# Patient Record
Sex: Female | Born: 1955 | Race: White | Hispanic: No | Smoking: Never smoker
Health system: Southern US, Community
[De-identification: ages and names within clinical notes are randomized; demographics above are authoritative.]

## PROBLEM LIST (undated history)

## (undated) DIAGNOSIS — F32A Depression, unspecified: Secondary | ICD-10-CM

## (undated) DIAGNOSIS — F329 Major depressive disorder, single episode, unspecified: Secondary | ICD-10-CM

## (undated) DIAGNOSIS — F419 Anxiety disorder, unspecified: Secondary | ICD-10-CM

## (undated) DIAGNOSIS — K219 Gastro-esophageal reflux disease without esophagitis: Secondary | ICD-10-CM

## (undated) DIAGNOSIS — C3490 Malignant neoplasm of unspecified part of unspecified bronchus or lung: Secondary | ICD-10-CM

## (undated) HISTORY — DX: Gastro-esophageal reflux disease without esophagitis: K21.9

## (undated) HISTORY — PX: CHOLECYSTECTOMY: SHX55

## (undated) HISTORY — DX: Major depressive disorder, single episode, unspecified: F32.9

## (undated) HISTORY — DX: Anxiety disorder, unspecified: F41.9

## (undated) HISTORY — PX: BREAST SURGERY: SHX581

## (undated) HISTORY — DX: Depression, unspecified: F32.A

## (undated) HISTORY — DX: Malignant neoplasm of unspecified part of unspecified bronchus or lung: C34.90

## (undated) HISTORY — PX: ABDOMINAL HYSTERECTOMY: SHX81

---

## 1998-08-19 ENCOUNTER — Encounter: Admission: RE | Admit: 1998-08-19 | Discharge: 1998-08-19 | Payer: Self-pay | Admitting: Family Medicine

## 1998-09-07 ENCOUNTER — Encounter: Admission: RE | Admit: 1998-09-07 | Discharge: 1998-09-07 | Payer: Self-pay | Admitting: Family Medicine

## 2000-09-20 ENCOUNTER — Encounter: Admission: RE | Admit: 2000-09-20 | Discharge: 2000-09-20 | Payer: Self-pay | Admitting: Family Medicine

## 2000-10-20 ENCOUNTER — Emergency Department (HOSPITAL_COMMUNITY): Admission: EM | Admit: 2000-10-20 | Discharge: 2000-10-20 | Payer: Self-pay | Admitting: *Deleted

## 2001-02-04 ENCOUNTER — Encounter: Payer: Self-pay | Admitting: Internal Medicine

## 2001-02-04 ENCOUNTER — Ambulatory Visit (HOSPITAL_COMMUNITY): Admission: RE | Admit: 2001-02-04 | Discharge: 2001-02-04 | Payer: Self-pay | Admitting: Internal Medicine

## 2001-02-06 ENCOUNTER — Ambulatory Visit (HOSPITAL_COMMUNITY): Admission: RE | Admit: 2001-02-06 | Discharge: 2001-02-06 | Payer: Self-pay | Admitting: Internal Medicine

## 2001-02-06 ENCOUNTER — Encounter: Payer: Self-pay | Admitting: Internal Medicine

## 2001-12-13 ENCOUNTER — Ambulatory Visit (HOSPITAL_COMMUNITY): Admission: RE | Admit: 2001-12-13 | Discharge: 2001-12-13 | Payer: Self-pay | Admitting: Internal Medicine

## 2001-12-13 ENCOUNTER — Encounter: Payer: Self-pay | Admitting: Internal Medicine

## 2002-03-26 ENCOUNTER — Emergency Department (HOSPITAL_COMMUNITY): Admission: EM | Admit: 2002-03-26 | Discharge: 2002-03-27 | Payer: Self-pay | Admitting: Emergency Medicine

## 2002-03-27 ENCOUNTER — Encounter: Payer: Self-pay | Admitting: Emergency Medicine

## 2002-04-04 ENCOUNTER — Encounter: Payer: Self-pay | Admitting: *Deleted

## 2002-04-04 ENCOUNTER — Ambulatory Visit (HOSPITAL_COMMUNITY): Admission: RE | Admit: 2002-04-04 | Discharge: 2002-04-04 | Payer: Self-pay | Admitting: *Deleted

## 2002-08-26 ENCOUNTER — Encounter: Payer: Self-pay | Admitting: Internal Medicine

## 2002-08-26 ENCOUNTER — Encounter: Admission: RE | Admit: 2002-08-26 | Discharge: 2002-08-26 | Payer: Self-pay | Admitting: Internal Medicine

## 2003-05-18 ENCOUNTER — Encounter: Admission: RE | Admit: 2003-05-18 | Discharge: 2003-05-18 | Payer: Self-pay | Admitting: Internal Medicine

## 2003-10-16 ENCOUNTER — Ambulatory Visit (HOSPITAL_COMMUNITY): Admission: RE | Admit: 2003-10-16 | Discharge: 2003-10-16 | Payer: Self-pay | Admitting: Pulmonary Disease

## 2005-03-30 ENCOUNTER — Ambulatory Visit (HOSPITAL_COMMUNITY): Admission: RE | Admit: 2005-03-30 | Discharge: 2005-03-30 | Payer: Self-pay | Admitting: Preventative Medicine

## 2005-11-15 ENCOUNTER — Emergency Department (HOSPITAL_COMMUNITY): Admission: EM | Admit: 2005-11-15 | Discharge: 2005-11-15 | Payer: Self-pay | Admitting: Emergency Medicine

## 2005-11-17 ENCOUNTER — Emergency Department (HOSPITAL_COMMUNITY): Admission: EM | Admit: 2005-11-17 | Discharge: 2005-11-17 | Payer: Self-pay | Admitting: Emergency Medicine

## 2005-11-26 ENCOUNTER — Emergency Department (HOSPITAL_COMMUNITY): Admission: EM | Admit: 2005-11-26 | Discharge: 2005-11-26 | Payer: Self-pay | Admitting: Family Medicine

## 2006-08-25 ENCOUNTER — Emergency Department (HOSPITAL_COMMUNITY): Admission: EM | Admit: 2006-08-25 | Discharge: 2006-08-25 | Payer: Self-pay | Admitting: Emergency Medicine

## 2007-02-27 ENCOUNTER — Emergency Department (HOSPITAL_COMMUNITY): Admission: EM | Admit: 2007-02-27 | Discharge: 2007-02-27 | Payer: Self-pay | Admitting: Emergency Medicine

## 2007-04-13 ENCOUNTER — Emergency Department (HOSPITAL_COMMUNITY): Admission: EM | Admit: 2007-04-13 | Discharge: 2007-04-13 | Payer: Self-pay | Admitting: Emergency Medicine

## 2008-06-05 ENCOUNTER — Ambulatory Visit (HOSPITAL_COMMUNITY): Admission: RE | Admit: 2008-06-05 | Discharge: 2008-06-05 | Payer: Self-pay | Admitting: Preventative Medicine

## 2008-06-15 ENCOUNTER — Encounter: Admission: RE | Admit: 2008-06-15 | Discharge: 2008-06-15 | Payer: Self-pay | Admitting: Preventative Medicine

## 2008-06-15 ENCOUNTER — Encounter (INDEPENDENT_AMBULATORY_CARE_PROVIDER_SITE_OTHER): Payer: Self-pay | Admitting: Diagnostic Radiology

## 2008-12-15 ENCOUNTER — Encounter: Admission: RE | Admit: 2008-12-15 | Discharge: 2008-12-15 | Payer: Self-pay | Admitting: Obstetrics & Gynecology

## 2010-08-23 ENCOUNTER — Emergency Department (HOSPITAL_COMMUNITY)
Admission: EM | Admit: 2010-08-23 | Discharge: 2010-08-23 | Disposition: A | Discharge status: Home / Self Care | Payer: No Typology Code available for payment source | Attending: Emergency Medicine | Admitting: Emergency Medicine

## 2010-08-23 ENCOUNTER — Emergency Department (HOSPITAL_COMMUNITY): Payer: No Typology Code available for payment source

## 2010-08-23 DIAGNOSIS — J218 Acute bronchiolitis due to other specified organisms: Secondary | ICD-10-CM | POA: Insufficient documentation

## 2010-08-23 DIAGNOSIS — R079 Chest pain, unspecified: Secondary | ICD-10-CM

## 2010-08-23 DIAGNOSIS — R799 Abnormal finding of blood chemistry, unspecified: Secondary | ICD-10-CM | POA: Insufficient documentation

## 2010-08-23 DIAGNOSIS — R5381 Other malaise: Secondary | ICD-10-CM | POA: Insufficient documentation

## 2010-08-23 DIAGNOSIS — R5383 Other fatigue: Secondary | ICD-10-CM | POA: Insufficient documentation

## 2010-08-23 DIAGNOSIS — Z9889 Other specified postprocedural states: Secondary | ICD-10-CM | POA: Insufficient documentation

## 2010-08-23 DIAGNOSIS — R072 Precordial pain: Secondary | ICD-10-CM | POA: Insufficient documentation

## 2010-08-23 LAB — DIFFERENTIAL
Basophils Absolute: 0 10*3/uL (ref 0.0–0.1)
Basophils Relative: 1 % (ref 0–1)
Eosinophils Relative: 1 % (ref 0–5)
Lymphocytes Relative: 29 % (ref 12–46)
Neutro Abs: 4 10*3/uL (ref 1.7–7.7)

## 2010-08-23 LAB — D-DIMER, QUANTITATIVE: D-Dimer, Quant: 0.58 ug/mL-FEU — ABNORMAL HIGH (ref 0.00–0.48)

## 2010-08-23 LAB — POCT CARDIAC MARKERS
CKMB, poc: <1 ng/mL — ABNORMAL LOW (ref 1.0–8.0)
Myoglobin, poc: 32.9 ng/mL (ref 12–200)
Troponin i, poc: <0.05 ng/mL (ref 0.00–0.09)

## 2010-08-23 LAB — CK TOTAL AND CKMB (NOT AT ARMC)
Relative Index: INVALID (ref 0.0–2.5)
Total CK: 49 U/L (ref 7–177)

## 2010-08-23 LAB — CBC
HCT: 41.9 % (ref 36.0–46.0)
Hemoglobin: 14.1 g/dL (ref 12.0–15.0)
MCHC: 33.7 g/dL (ref 30.0–36.0)
RDW: 11.7 % (ref 11.5–15.5)
WBC: 6.5 10*3/uL (ref 4.0–10.5)

## 2010-08-23 LAB — POCT I-STAT, CHEM 8
Chloride: 105 mEq/L (ref 96–112)
Creatinine, Ser: 0.7 mg/dL (ref 0.4–1.2)
Glucose, Bld: 95 mg/dL (ref 70–99)
Potassium: 3.6 mEq/L (ref 3.5–5.1)
Sodium: 142 mEq/L (ref 135–145)

## 2010-08-23 MED ORDER — IOHEXOL 300 MG/ML  SOLN
80.0000 mL | Freq: Once | INTRAMUSCULAR | Status: AC | PRN
  Administered 2010-08-23: 80 mL via INTRAVENOUS

## 2010-08-30 NOTE — Consult Note (Signed)
Tracey Fox, Tracey Fox                ACCOUNT NO.:  [id number]  MEDICAL RECORD NO.:  [id number]           PATIENT TYPE:  E  LOCATION:  [facility]                         FACILITY:  [facility]  PHYSICIAN:  Madolyn Frieze. Jens Som, MD, FACCDATE OF BIRTH:  05-03-1956  DATE OF CONSULTATION:  08/23/2010 DATE OF DISCHARGE:                                CONSULTATION   PRIMARY CARE PHYSICIAN:  At Urgent Care.  PRIMARY CARDIOLOGIST:  None.  CHIEF COMPLAINT:  Chest pain.  HISTORY OF PRESENT ILLNESS:  Tracey Fox is a 55 year old female with no previous history of coronary artery disease.  She had onset of substernal chest pain 4 days ago.  It has been continuous since then. She describes it as a pressure.  At its worst, it was an 8/10.  At its least, it was a 4/10.  She feels that it is somewhat worse with deep inspiration.  She tried a Xanax, which helped to relax, but she does not feel like it made any real change in her chest pain.  She also tried ibuprofen, which did not give her significant relief.  She does not feel like there was any change in her pain with position or exertion. Resting was no help.  She has had some anxiety and social stressors.  On August 20, 2010, she had left arm pain, but this resolved.  The pain was associated with shortness of breath and occasional diaphoresis, but no nausea or vomiting.  She had an evaluation for a sharp chest pain in approximately 2003, but those symptoms are nothing like these.  Today she was concerned because the pain had not resolved and went to Urgent Care.  She was referred to the emergency room for ongoing symptoms and an EKG that was a little abnormal.  Currently she is complaining of pain at a 4 or 5/10.  PAST MEDICAL HISTORY: 1. History of chest pain in approximately 2003 for which she saw Dr.     Domingo Sep.  Per the patient, she had a stress test and an echo that     were okay. 2. Family history of premature coronary artery disease.  SURGICAL  HISTORY:  She is status post hysterectomy, cholecystectomy, and breast biopsy.  ALLERGIES:  No known drug allergies.  MEDICATIONS:  No prescription medications and only occasional ibuprofen.  SOCIAL HISTORY:  She lives in [place] with her daughter.  She is separated from her husband.  She works as an Print production planner.  She has no history of alcohol, tobacco, or drug abuse.  She has multiple social stressors right now.  FAMILY HISTORY:  Her mother is alive in her 53s with Alzheimer's, but no heart disease and her father died at 32 with an MI.  She had a sister that died of breast cancer in her 84s.  REVIEW OF SYSTEMS:  She has had some diaphoresis with the chest pain. She also has occasional chills.  She has occasional headaches and arthralgias.  She has felt anxious recently.  She denies GI symptoms or reflux.  There has been no melena.  Chest pain and shortness of breath as described above.  Full 14-point review of systems is otherwise negative except as stated in the HPI.  PHYSICAL EXAMINATION:  VITAL SIGNS:  Temperature is 97.9, blood pressure 114/70, pulse 64, respiratory rate 18, O2 saturation 100% on room air. GENERAL:  She is a well-developed, well-nourished white female who is anxious. HEENT:  Normal. NECK:  There is no lymphadenopathy, thyromegaly, bruit, or JVD noted. CV:  Heart is regular in rate and rhythm with an S1 and S2 and no significant murmur, rub, or gallop is noted.  Distal pulses are intact in all four extremities. LUNGS:  Clear to auscultation bilaterally. SKIN:  No rashes or lesions are noted. ABDOMEN:  Soft and nontender with active bowel sounds. EXTREMITIES:  There is no cyanosis, clubbing, or edema noted. MUSCULOSKELETAL:  There is no joint deformity or effusions.  Her chest wall is slightly tender to palpation.  There is no spine or CVA tenderness. NEURO:  She is alert and oriented.  Cranial nerves II through XII grossly intact.  Chest x-ray  shows mild interstitial prominence, but no pneumonia or cardiac enlargement.  EKG sinus brady with rate 59 with no acute ischemic changes.  Laboratory values show point-of-care markers negative x1 with other labs pending at the time of dictation.  IMPRESSION:  Tracey Fox was seen today by Dr. Jens Som, the patient evaluated and the data reviewed.  She is a 55 year old female with no significant past medical history who had chest pain.  She complains of 4 continuous days of chest pain that she describes as a pressure without radiation.  There are no clearly associated symptoms.  There is possibly a pleuritic component.  It is not exertional, not related to food.  The patient feels it is related to "stress."  Her EKG is sinus with anterior T-wave changes.  She has chest pain with palpation of her chest wall. It is continuous for 4 days without complete resolution.  Full enzymes including a CK-MB and troponin are pending as well as a D-dimer.  If they are negative then she has essentially ruled out.  The D-dimer will help [place] assess the pleuritic component, although we doubt PE.  If the above testing is negative, she does not need admission from a cardiac standpoint.  The emergency room staff is aware of this.  From a cardiac standpoint, she can be discharged with a short course of Motrin and follow up primary care.     Theodore Demark, PA-C   ______________________________ Madolyn Frieze. Jens Som, MD, [facility]    RB/MEDQ  D:  08/23/2010  T:  08/24/2010  Job:  [id number]  Electronically Signed by Theodore Demark PA-C on 08/27/2010 04:51:54 PM Electronically Signed by Olga Millers MD FACC on 08/30/2010 05:22:26 AM

## 2011-02-21 LAB — POCT URINALYSIS DIP (DEVICE)
Ketones, ur: NEGATIVE
Operator id: 247071
Protein, ur: NEGATIVE
Specific Gravity, Urine: 1.02
Urobilinogen, UA: 0.2
pH: 5.5

## 2011-02-21 LAB — URINE CULTURE

## 2011-02-21 LAB — URINALYSIS, ROUTINE W REFLEX MICROSCOPIC
Bilirubin Urine: NEGATIVE
Nitrite: NEGATIVE
Specific Gravity, Urine: 1.018
Urobilinogen, UA: 1
pH: 5.5

## 2011-02-21 LAB — URINE MICROSCOPIC-ADD ON

## 2011-02-23 ENCOUNTER — Institutional Professional Consult (permissible substitution): Payer: No Typology Code available for payment source | Admitting: Pulmonary Disease

## 2011-10-29 ENCOUNTER — Encounter (HOSPITAL_COMMUNITY): Payer: Self-pay | Admitting: Emergency Medicine

## 2011-10-29 ENCOUNTER — Ambulatory Visit: Payer: PRIVATE HEALTH INSURANCE

## 2011-10-29 ENCOUNTER — Ambulatory Visit (INDEPENDENT_AMBULATORY_CARE_PROVIDER_SITE_OTHER): Payer: PRIVATE HEALTH INSURANCE | Admitting: Emergency Medicine

## 2011-10-29 ENCOUNTER — Observation Stay (HOSPITAL_COMMUNITY)
Admission: EM | Admit: 2011-10-29 | Discharge: 2011-10-30 | Disposition: A | Discharge status: Home / Self Care | Payer: 59 | Attending: Emergency Medicine | Admitting: Emergency Medicine

## 2011-10-29 VITALS — BP 121/73 | HR 62 | Temp 97.9°F | Resp 16 | Ht 67.75 in | Wt 144.0 lb

## 2011-10-29 DIAGNOSIS — R079 Chest pain, unspecified: Principal | ICD-10-CM | POA: Insufficient documentation

## 2011-10-29 DIAGNOSIS — R062 Wheezing: Secondary | ICD-10-CM | POA: Insufficient documentation

## 2011-10-29 DIAGNOSIS — R5383 Other fatigue: Secondary | ICD-10-CM | POA: Insufficient documentation

## 2011-10-29 DIAGNOSIS — R5381 Other malaise: Secondary | ICD-10-CM | POA: Insufficient documentation

## 2011-10-29 DIAGNOSIS — R911 Solitary pulmonary nodule: Secondary | ICD-10-CM | POA: Insufficient documentation

## 2011-10-29 LAB — POCT I-STAT TROPONIN I: Troponin i, poc: 0 ng/mL (ref 0.00–0.08)

## 2011-10-29 LAB — BASIC METABOLIC PANEL
BUN: 11 mg/dL (ref 6–23)
Calcium: 10 mg/dL (ref 8.4–10.5)
Chloride: 101 mEq/L (ref 96–112)
Creatinine, Ser: 0.64 mg/dL (ref 0.50–1.10)
GFR calc Af Amer: >90 mL/min (ref >90)
GFR calc non Af Amer: >90 mL/min (ref >90)

## 2011-10-29 LAB — CBC
HCT: 41.4 % (ref 36.0–46.0)
MCH: 31 pg (ref 26.0–34.0)
MCHC: 35.5 g/dL (ref 30.0–36.0)
MCV: 87.3 fL (ref 78.0–100.0)
Platelets: 232 10*3/uL (ref 150–400)
RDW: 11.9 % (ref 11.5–15.5)
WBC: 6 10*3/uL (ref 4.0–10.5)

## 2011-10-29 MED ORDER — ALPRAZOLAM 0.25 MG PO TABS
1.0000 mg | ORAL_TABLET | ORAL | Status: DC | PRN
  Administered 2011-10-29: 1 mg via ORAL
  Filled 2011-10-29: qty 4

## 2011-10-29 MED ORDER — SODIUM CHLORIDE 0.9 % IV SOLN
1000.0000 mL | INTRAVENOUS | Status: DC
  Administered 2011-10-30: 1000 mL via INTRAVENOUS

## 2011-10-29 NOTE — Progress Notes (Signed)
Subjective:    Patient ID: Tracey Fox, female    DOB: November 08, 1955, 56 y.o.   MRN: [id number]  Chest Pain  This is a recurrent problem. The current episode started in the past 7 days. The onset quality is gradual. The problem occurs 2 to 4 times per day. The problem has been unchanged. The pain is present in the substernal region. The pain is at a severity of 4/10. The pain is moderate. The quality of the pain is described as dull and pressure. The pain does not radiate. Associated symptoms include irregular heartbeat, malaise/fatigue and palpitations. Pertinent negatives include no abdominal pain, back pain, claudication, cough, diaphoresis, dizziness, exertional chest pressure, fever, headaches, hemoptysis, leg pain, lower extremity edema, nausea, near-syncope, numbness, orthopnea, PND, shortness of breath, sputum production, syncope, vomiting or weakness. The pain is aggravated by nothing. She has tried nothing for the symptoms. Risk factors include stress, post-menopausal, lack of exercise and sedentary lifestyle.  Pertinent negatives for past medical history include no aneurysm, no anxiety/panic attacks, no aortic aneurysm, no aortic dissection, no arrhythmia, no bicuspid aortic valve, no CAD, no cancer, no congenital heart disease, no connective tissue disease, no COPD, no CHF, no diabetes, no DVT, no hyperhomocysteinemia, no hyperlipidemia, no hypertension, no Kawasaki disease, no Marfan's syndrome, no MI, no mitral valve prolapse, no pacemaker, no PE, no PVD, no recent injury, no rheumatic fever, no seizures, no sickle cell disease, no sleep apnea, no spontaneous pneumothorax, no stimulant use, no strokes, no thyroid problem, no TIA, Turner syndrome and no valve disorder.  Pertinent negatives for family medical history include: family history of aortic dissection, no CAD in family, no connective tissue disease in family, no diabetes in family, no heart disease in family, no hyperlipidemia in  family, no hypertension in family, no Marfan's syndrome in family, no early MI in family, no PE in family, no PVD in family, no sickle cell disease in family, no stroke in family, no sudden death in family and no TIA in family.      Review of Systems  Constitutional: Positive for malaise/fatigue, activity change and fatigue. Negative for fever, diaphoresis and unexpected weight change.  HENT: Negative.   Eyes: Negative.   Respiratory: Positive for chest tightness. Negative for cough, hemoptysis, sputum production, choking and shortness of breath.   Cardiovascular: Positive for chest pain and palpitations. Negative for orthopnea, claudication, leg swelling, syncope, PND and near-syncope.  Gastrointestinal: Negative.  Negative for nausea, vomiting and abdominal pain.  Genitourinary: Negative.   Musculoskeletal: Negative.  Negative for back pain.  Neurological: Negative.  Negative for dizziness, seizures, weakness, numbness and headaches.       Objective:   Physical Exam  Constitutional: She is oriented to person, place, and time. She appears well-developed and well-nourished.  HENT:  Head: Normocephalic and atraumatic.  Right Ear: External ear normal.  Left Ear: External ear normal.  Eyes: Conjunctivae and EOM are normal. Pupils are equal, round, and reactive to light. No scleral icterus.  Neck: Normal range of motion. Neck supple.  Cardiovascular: Normal rate, regular rhythm and normal heart sounds.  Exam reveals no gallop and no friction rub.   No murmur heard. Pulmonary/Chest: Effort normal and breath sounds normal. She has no wheezes. She has no rales. She exhibits no tenderness.  Abdominal: Soft.  Musculoskeletal: Normal range of motion.  Neurological: She is alert and oriented to person, place, and time.  Skin: Skin is warm and dry.  Assessment & Plan:  1 week history of increasing fatigue and chest pain that is not related to activity and not relieved by  anything.  Says lasts 30 minutes or so but today's pain started at work several hours ago.    Incidentally told that she had a "knot on her lung" a year ago and was referred to a pulmonologist but never did.  [facility] reading (PRIMARY) by  Dr. Dareen Piano.  Abnormal air collection anterior to mediastinum.  Will refer to ER tonight for further evaluation

## 2011-10-29 NOTE — ED Notes (Signed)
[facility]  Pt does not have EKG with her and unable to locate it in [facility].  Pt does have a CD with CXR.

## 2011-10-29 NOTE — ED Provider Notes (Signed)
History     CSN: [id number]  Arrival date & time 10/29/11  1840   First MD Initiated Contact with Patient 10/29/11 2127      Chief Complaint  Patient presents with  . Chest Pain    (Consider location/radiation/quality/duration/timing/severity/associated sxs/prior treatment) HPI Comments: Patient with no significant past medical history presents emergency department with chief complaint of chest pain.  Patient was sent over from urgent care for "an abnormal finding on chest x-ray".  Imaging reviewed with attending and chronic incidental finding of 6 mm nodule was seen and radiologist's interpretation indicates no acute cardiopulmonary process.  Patient states that she's been having intermittent chest pain for the last 7 days located substernally described as a cold pressure, lasting approximately an hour and associated with ED fatigue.  Patient denies any shortness of breath, dyspnea on exertion, orthopnea, PND, leg swelling, palpitations, jaw pain or arm pain, cough, fever, night sweats or chills.  Patient has no other complaints this time.  The history is provided by the patient.    History reviewed. No pertinent past medical history.  Past Surgical History  Procedure Date  . Abdominal hysterectomy   . Breast surgery   . Cholecystectomy     No family history on file.  History  Substance Use Topics  . Smoking status: Never Smoker   . Smokeless tobacco: Not on file  . Alcohol Use: No    OB History    Grav Para Term Preterm Abortions TAB SAB Ect Mult Living                  Review of Systems  Constitutional: Positive for activity change and fatigue. Negative for fever, chills, diaphoresis and unexpected weight change.  HENT: Negative for congestion, neck pain and neck stiffness.   Eyes: Negative for visual disturbance.  Respiratory: Positive for chest tightness. Negative for apnea, cough, shortness of breath, wheezing and stridor.   Cardiovascular: Positive for chest  pain. Negative for palpitations and leg swelling.  Gastrointestinal: Negative for nausea, vomiting, abdominal pain, diarrhea and blood in stool.  Genitourinary: Negative for dysuria, urgency, hematuria and flank pain.  Musculoskeletal: Negative for myalgias, back pain and gait problem.  Skin: Negative for pallor.  Neurological: Negative for dizziness, syncope, weakness, light-headedness and headaches.  All other systems reviewed and are negative.    Allergies  Review of patient's allergies indicates no known allergies.  Home Medications   Current Outpatient Rx  Name Route Sig Dispense Refill  . ALPRAZOLAM 1 MG PO TABS Oral Take 1 mg by mouth at bedtime as needed. For sleep    . IBUPROFEN 200 MG PO TABS Oral Take 600 mg by mouth every 6 (six) hours as needed. For pain      BP 124/73  Pulse 60  Temp 97.8 F (36.6 C) (Oral)  Resp 20  SpO2 96%  Physical Exam  Nursing note and vitals reviewed. Constitutional: She appears well-developed and well-nourished. No distress.  HENT:  Head: Normocephalic and atraumatic.  Eyes: Conjunctivae and EOM are normal. Pupils are equal, round, and reactive to light.  Neck: Normal range of motion. Neck supple. Normal carotid pulses and no JVD present. Carotid bruit is not present. No rigidity. Normal range of motion present.  Cardiovascular: Normal rate, regular rhythm, S1 normal, S2 normal, normal heart sounds, intact distal pulses and normal pulses.  Exam reveals no gallop and no friction rub.   No murmur heard.      No pitting edema bilaterally, RRR, no  aberrant sounds on auscultations, distal pulses intact, no carotid bruit or JVD.   Pulmonary/Chest: Effort normal. No accessory muscle usage or stridor. She exhibits no bony tenderness.       Mild inspiratory wheezing heard in right upper lung, no respiratory distress, accessory muscle use or nasal flaring.  Abdominal: Bowel sounds are normal.       Soft non tender. Non pulsatile aorta.   Skin:  Skin is warm, dry and intact. No rash noted. She is not diaphoretic. No cyanosis. Nails show no clubbing.    ED Course  Procedures (including critical care time)   Labs Reviewed  CBC  BASIC METABOLIC PANEL  POCT I-STAT TROPONIN I   Dg Chest 2 View  10/29/2011  *RADIOLOGY REPORT*  Clinical Data: Chest pain.  Abnormal chest x-ray in the past.  CHEST - 2 VIEW  Comparison: 08/23/2010.  Findings: Cardiopericardial silhouette and mediastinal contours are normal. No airspace disease or effusion.  Mild bilateral pleural apical scarring.  Prominent nipple shadows are present bilaterally.  Previously seen 6 mm pulmonary nodule appears present in the right upper lobe interposed between the anterior first and second ribs. Enlargement retrosternal clear space is present, however this appears similar to the prior exam of 02/27/2007.  Retrosternal nodular density not definitively identified on today's exam.  IMPRESSION: No acute cardiopulmonary disease.  Clinically significant discrepancy from primary report, if provided: None  Original Report Authenticated By: Andreas Newport, M.D.     No diagnosis found.   Date: 10/29/2011  Rate: 52  Rhythm: Sinus brady  QRS Axis: normal  Intervals: normal  ST/T Wave abnormalities: normal  Conduction Disutrbances: none  Narrative Interpretation:   Old EKG Reviewed: No significant changes noted     MDM  Chest pain  Patient moved to CDU under chest pain protocol. Patient resting comfortably at present without return of chest pain. Lungs CTA bilaterally. S1/S2, RRR, no murmur. Abdomen soft, bowel sounds present. Strong distal pulses palpated all extremities. Sinus rhythm on monitor without ectopy. Troponin negative x 1, 12 lead reviewed, no indication of ischemia. Patient scheduled for coronary CT in AM. Diagnostic and treatment plan discussed with patient.         Jaci Carrel, [place] 10/29/11 2250

## 2011-10-29 NOTE — ED Notes (Addendum)
C/o intermittent dull pain in center of chest with sob and generalized weakness x 1 week.  [facility] sent [facility] for "abnormal [facility]R."

## 2011-10-30 ENCOUNTER — Observation Stay (HOSPITAL_COMMUNITY): Payer: 59

## 2011-10-30 MED ORDER — NITROGLYCERIN 0.4 MG SL SUBL
SUBLINGUAL_TABLET | SUBLINGUAL | Status: AC
  Administered 2011-10-30: 10:00:00
  Filled 2011-10-30: qty 25

## 2011-10-30 MED ORDER — ASPIRIN 81 MG PO CHEW
324.0000 mg | CHEWABLE_TABLET | Freq: Once | ORAL | Status: AC
  Administered 2011-10-30: 324 mg via ORAL

## 2011-10-30 MED ORDER — OMEPRAZOLE 20 MG PO CPDR: 20.0000 mg | DELAYED_RELEASE_CAPSULE | Freq: Every day | ORAL | Status: DC

## 2011-10-30 MED ORDER — IOHEXOL 350 MG/ML SOLN
80.0000 mL | Freq: Once | INTRAVENOUS | Status: AC | PRN
  Administered 2011-10-30: 80 mL via INTRAVENOUS

## 2011-10-30 MED ORDER — METOPROLOL TARTRATE 1 MG/ML IV SOLN
INTRAVENOUS | Status: AC
  Administered 2011-10-30: 5 mg
  Filled 2011-10-30: qty 5

## 2011-10-30 MED ORDER — ASPIRIN 81 MG PO CHEW
CHEWABLE_TABLET | ORAL | Status: AC
  Filled 2011-10-30: qty 4

## 2011-10-30 NOTE — ED Notes (Signed)
Did not provide pt with AM dose of Lopressor per CP protocol.

## 2011-10-30 NOTE — Discharge Instructions (Signed)
Chest Pain (Nonspecific) Your testing today did not show any evidence of coronary artery disease. You have lung nodules and a repeat CT scan in one year. Take the medication for her stomach and that may be causing your chest pain. She is a Dr. from the attached list. Return to the ED if you develop they're worsening symptoms. It is often hard to give a specific diagnosis for the cause of chest pain. There is always a chance that your pain could be related to something serious, such as a heart attack or a blood clot in the lungs. You need to follow up with your caregiver for further evaluation. CAUSES   Heartburn.   Pneumonia or bronchitis.   Anxiety or stress.   Inflammation around your heart (pericarditis) or lung (pleuritis or pleurisy).   A blood clot in the lung.   A collapsed lung (pneumothorax). It can develop suddenly on its own (spontaneous pneumothorax) or from injury (trauma) to the chest.   Shingles infection (herpes zoster virus).  The chest wall is composed of bones, muscles, and cartilage. Any of these can be the source of the pain.  The bones can be bruised by injury.   The muscles or cartilage can be strained by coughing or overwork.   The cartilage can be affected by inflammation and become sore (costochondritis).  DIAGNOSIS  Lab tests or other studies, such as X-rays, electrocardiography, stress testing, or cardiac imaging, may be needed to find the cause of your pain.  TREATMENT   Treatment depends on what may be causing your chest pain. Treatment may include:   Acid blockers for heartburn.   Anti-inflammatory medicine.   Pain medicine for inflammatory conditions.   Antibiotics if an infection is present.   You may be advised to change lifestyle habits. This includes stopping smoking and avoiding alcohol, caffeine, and chocolate.   You may be advised to keep your head raised (elevated) when sleeping. This reduces the chance of acid going backward from your  stomach into your esophagus.   Most of the time, nonspecific chest pain will improve within 2 to 3 days with rest and mild pain medicine.  HOME CARE INSTRUCTIONS   If antibiotics were prescribed, take your antibiotics as directed. Finish them even if you start to feel better.   For the next few days, avoid physical activities that bring on chest pain. Continue physical activities as directed.   Do not smoke.   Avoid drinking alcohol.   Only take over-the-counter or prescription medicine for pain, discomfort, or fever as directed by your caregiver.   Follow your caregiver's suggestions for further testing if your chest pain does not go away.   Keep any follow-up appointments you made. If you do not go to an appointment, you could develop lasting (chronic) problems with pain. If there is any problem keeping an appointment, you must call to reschedule.  SEEK MEDICAL CARE IF:   You think you are having problems from the medicine you are taking. Read your medicine instructions carefully.   Your chest pain does not go away, even after treatment.   You develop a rash with blisters on your chest.  SEEK IMMEDIATE MEDICAL CARE IF:   You have increased chest pain or pain that spreads to your arm, neck, jaw, back, or abdomen.   You develop shortness of breath, an increasing cough, or you are coughing up blood.   You have severe back or abdominal pain, feel nauseous, or vomit.   You develop  severe weakness, fainting, or chills.   You have a fever.  THIS IS AN EMERGENCY. Do not wait to see if the pain will go away. Get medical help at once. Call your local emergency services (911 in [place] Do not drive yourself to the hospital. MAKE SURE YOU:   Understand these instructions.   Will watch your condition.   Will get help right away if you are not doing well or get worse.  Document Released: 02/08/2005 Document Revised: 04/20/2011 Document Reviewed: 12/05/2007 [facility] Patient  Information 2012 [place], [place].  RESOURCE GUIDE  Chronic Pain Problems: Contact [facility]  [phone] Patients need to be referred by their primary care doctor.  Insufficient Money for Medicine: Contact United Way:  call "211" or [facility] [phone].  No Primary Care Doctor: - Call Health Connect  [phone] - can help you locate a primary care doctor that  accepts your insurance, provides certain services, etc. - Physician Referral Service- [phone]  Agencies that provide inexpensive medical care: - [facility]  [phone] - [facility]  [phone] - [facility]  [phone] - Women's Clinic  [phone] - Planned Parenthood  [phone] [facility]  [phone]  Medicaid-accepting [facility] Providers: - [facility]- [address]  [phone], Mon-Fri 9am-7pm, Sat 9am-1pm - [facility]- [address] [place], Suite [place]  [phone] - [facility]- [address], [address] [place]  [phone] [facility]- [address]  [phone] - Renaye Rakers- [address] [place], [address], [phone]  Only accepts [place] patients after they have their name  applied to their card  Self Pay (no insurance) in [place]: - Sickle Cell Patients: Dr Willey Blade, [facility]  [address] [place], [phone] - [facility]- [address] [place]  [phone]       [facility] [place]- [address]       -     [facility]- see information above (Speak to [employer] if you do not have insurance)       -  [facility]- [address] [place], [phone]       -  [facility]- [address],  [phone]       -  [facility]- [address] [phone]       -  Dr Julio Sicks-  [address], [place], [phone]        -  [facility]- [address] [phone]       -  [facility]- [address], [phone], also [address], [phone]       -    [facility]- [address] [place], [phone], 1st & 3rd Saturday   every month, 10am-1pm  1) Find a Doctor and Pay Out of Pocket Although you won't have to find out who is covered by your insurance plan, it is a good idea to ask around and get recommendations. You will then need to call the office and see if the doctor you have chosen will accept you as a new patient and what types of options they offer for patients who are self-pay. Some doctors offer discounts or will set up payment plans  for their patients who do not have insurance, but you will need to ask so you aren't surprised when you get to your appointment.  2) Contact Your Local Health Department Not all health departments have doctors that can see patients for sick visits, but many do, so it is worth a call to see if yours does. If you don't know where your local health department is, you can check in your phone book. The CDC also has a tool to help you locate your state's health department, and many state websites also have listings of all of their local health departments.  3) Find a Walk-in Clinic If your illness is not likely to be very severe or complicated, you may want to try a walk in clinic. These are popping up all over the country in pharmacies, drugstores, and shopping centers. They're usually staffed by nurse practitioners or physician assistants that have been trained to treat common illnesses and complaints. They're usually fairly quick and inexpensive. However, if you have serious medical issues or chronic medical problems, these are probably not your best option  STD Testing - [facility] Department of [facility], STD Clinic, [address] [place], phone [phone] or [phone].  Monday - Friday, call for an  appointment. [facility] Department of [employer], STD Clinic, [place] [address], [place], phone [phone] or [phone].  Monday - Friday, call for an appointment.  Abuse/Neglect: [facility] Child Abuse Hotline 332-573-5554 [facility] Child Abuse Hotline 518-838-0040 (After Hours)  Emergency Shelter:  [facility] 940-066-8969  Maternity Homes: - [facility] (575) [phone] - [facility] (862) [phone]  MRSA Hotline #:   209-646-1598  [facility] Resources  [facility]  United Way [facility] Dept. [address]                 [address]         [address] [place] [address]  Blondell Reveal Phone:  [phone]                                  Phone:  [phone]                   Phone:  [phone]  [facility] Mental Health, [phone] - [phone]       -     [facility] in [place], [address],                                  [phone], Insurance  [place] Child Abuse Hotline 279 698 4266 or 641-648-9074 (After Hours)   Behavioral Health Services  Substance Abuse Resources: - [facility]  (516) 786-7951 - [facility] [phone] - [facility]  [place] (203) [phone] Floydene Flock (947) [phone] - Residential & Outpatient Substance Abuse Program  [phone]  Psychological Services: [facility]  ([phone] Services  [phone] - [facility], [phone] [place]. [address], [place], ACCESS LINE: 854-384-7983 or 646-866-4216, [employer]  Dental Assistance  If unable to pay or uninsured, contact:  [facility] or [facility]. to become qualified  for the adult dental clinic.  Patients with Medicaid: [facility] [phone] [address], [phone] [address], [phone]  If unable to pay, or uninsured, contact [facility] 762-332-5292) or [facility] Department [phone] in [place], [phone] in [employer]) to become qualified for the adult dental clinic  Other Low-Cost Community Dental Services: - [facility]- [address] [place], [place], [place], [address], [phone], [phone], 2nd and 4th Thursday of the month at 6:30am.  10 clients each day by appointment, can sometimes see walk-in patients if someone does not show for an appointment. [facility]- [address] [place], [place], [address], [phone] - [facility]- [address], [place], [place], [address], [phone] - [facility]- [phone] [facility] Health Department- [phone] [facility] Department- [phone]

## 2011-10-30 NOTE — ED Notes (Signed)
BMI: 22

## 2011-10-30 NOTE — ED Notes (Signed)
Patient transported to CT 

## 2011-10-30 NOTE — Progress Notes (Signed)
Observation review is complete. 

## 2011-10-30 NOTE — ED Provider Notes (Signed)
CT coronary study complete. Calcium score 0. Patient does have a subtle finding of multiple pulmonary nodules. She is notified of these as well as followup recommendations a repeat CT scan 1 year. She will be started on PPI for possible esophagitis as an etiology of her pain as she did have a thickened esophagus on CT scan.  Glynn Octave, MD 10/30/11 [phone]

## 2011-11-07 ENCOUNTER — Institutional Professional Consult (permissible substitution): Payer: PRIVATE HEALTH INSURANCE | Admitting: Internal Medicine

## 2011-11-07 NOTE — ED Provider Notes (Signed)
Medical screening examination/treatment/procedure(s) were conducted as a shared visit with non-physician practitioner(s) and myself.  I personally evaluated the patient during the encounter.  Atypical chest pain. No previous cardiac disease. Will transfer to CDU for cardiac protocol  Donnetta Hutching, MD 11/07/11 [phone]

## 2011-11-29 ENCOUNTER — Encounter: Payer: Self-pay | Admitting: Internal Medicine

## 2011-11-29 ENCOUNTER — Ambulatory Visit (INDEPENDENT_AMBULATORY_CARE_PROVIDER_SITE_OTHER): Payer: PRIVATE HEALTH INSURANCE | Admitting: Internal Medicine

## 2011-11-29 VITALS — BP 110/64 | HR 54 | Temp 98.0°F | Ht 68.0 in | Wt 144.8 lb

## 2011-11-29 DIAGNOSIS — R9389 Abnormal findings on diagnostic imaging of other specified body structures: Secondary | ICD-10-CM

## 2011-11-29 DIAGNOSIS — R0609 Other forms of dyspnea: Secondary | ICD-10-CM

## 2011-11-29 DIAGNOSIS — R0600 Dyspnea, unspecified: Secondary | ICD-10-CM

## 2011-11-29 MED ORDER — ESOMEPRAZOLE MAGNESIUM 40 MG PO CPDR: 40.0000 mg | DELAYED_RELEASE_CAPSULE | Freq: Every day | ORAL | Status: DC

## 2011-11-29 NOTE — Patient Instructions (Addendum)
Nexium 40 mg Take 30-60 min before first meal of the day and Pepcid 20 mg at bedtime for at least a month to see if any of your symptoms improve.  Stop motrin  GERD (REFLUX)  is an extremely common cause of respiratory symptoms, many times with no significant heartburn at all.    It can be treated with medication, but also with lifestyle changes including avoidance of late meals, excessive alcohol, smoking cessation, and avoid fatty foods, chocolate, peppermint, colas, red wine, and acidic juices such as orange juice.  NO MINT OR MENTHOL PRODUCTS SO NO COUGH DROPS  USE SUGARLESS CANDY INSTEAD (jolley ranchers or Stover's)  NO OIL BASED VITAMINS - use powdered substitutes.    You will need a plain cxr in 6 months inside [facility] system (placed reminder in records) to follow up nodules but they very low risk of hurting you  Tracey Fox is an excellent primary care doctor

## 2011-11-29 NOTE — Progress Notes (Signed)
  Subjective:    Patient ID: Tracey Fox, female    DOB: [birth year]   MRN: [id number]  HPI   40 yowf never smoker no primary doctor referred by ER for abn scan  11/29/2011 1st pulmonary eval cc anxiety April 2012 rx xanax then around March 2013 fatigue, intermittent short breath not directly proportionate to activity, intermittent midline pain the main finding being esophagitis and MPN on ct chest taking lots of nsaids for "chest wall pain"   No unusual cough, purulent sputum or sinus/hb symptoms on present rx.  Sleeping ok without nocturnal  or early am exacerbation  of respiratory  c/o's or need for noct saba. Also denies any obvious fluctuation of symptoms with weather or environmental changes or other aggravating or alleviating factors except as outlined above       Review of Systems  Constitutional: Negative for fever and unexpected weight change.  HENT: Negative for ear pain, nosebleeds, congestion, sore throat, rhinorrhea, sneezing, trouble swallowing, dental problem, postnasal drip and sinus pressure.   Eyes: Negative for redness and itching.  Respiratory: Positive for shortness of breath and wheezing. Negative for cough and chest tightness.   Cardiovascular: Positive for palpitations. Negative for leg swelling.  Gastrointestinal: Positive for nausea. Negative for vomiting.  Genitourinary: Negative for dysuria.  Musculoskeletal: Negative for joint swelling.  Skin: Negative for rash.  Neurological: Positive for headaches.  Hematological: Does not bruise/bleed easily.  Psychiatric/Behavioral: Positive for dysphoric mood. The patient is nervous/anxious.        Objective:   Physical Exam  amb wf nad HEENT: nl dentition, turbinates, and orophanx. Nl external ear canals without cough reflex   NECK :  without JVD/Nodes/TM/ nl carotid upstrokes bilaterally   LUNGS: no acc muscle use, clear to A and P bilaterally without cough on insp or exp maneuvers   CV:  RRR  no s3  or murmur or increase in P2, no edema   ABD:  soft and nontender with nl excursion in the supine position. No bruits or organomegaly, bowel sounds nl  MS:  warm without deformities, calf tenderness, cyanosis or clubbing  SKIN: warm and dry without lesions    NEURO:  alert, approp, no deficits    CT chest 08/23/10 Faint ground-glass nodules in the medial right upper lobe, possibly  reflecting mild infectious bronchiolitis.  6 mm opacity in the right upper lobe. A follow-up CT in 6-12  months is suggested per Fleischner Society guidelines  Vs 10/30/11 CT Heart No evidence of significant coronary artery disease. The  patient's total coronary artery calcium score is zero.  2. Circumferential thickening of the distal esophagus. This may  suggest reflux esophagitis. Clinical correlation is recommended.  3. Multiple sub centimeter pulmonary nodules scattered throughout  the lungs bilaterally, favored to represent subpleural lymph nodes.  However, these do warrant attention on a follow-up study. If the  patient is at high risk for bronchogenic carcinoma, follow-up chest  CT at 6-12 months is recommended.      Assessment & Plan:

## 2011-12-03 DIAGNOSIS — R0609 Other forms of dyspnea: Secondary | ICD-10-CM | POA: Insufficient documentation

## 2011-12-03 DIAGNOSIS — R9389 Abnormal findings on diagnostic imaging of other specified body structures: Secondary | ICD-10-CM | POA: Insufficient documentation

## 2011-12-03 NOTE — Assessment & Plan Note (Signed)
Whatever the cause of the abn ct, Symptoms are markedly disproportionate to objective findings and not clear this is a lung problem but pt does appear to have difficult airway management issues.  DDX of  difficult airways managment all start with A and  include Adherence, Ace Inhibitors, Acid Reflux, Active Sinus Disease, Alpha 1 Antitripsin deficiency, Anxiety masquerading as Airways dz,  ABPA,  allergy(esp in young), Aspiration (esp in elderly), Adverse effects of DPI,  Active smokers, plus two Bs  = Bronchiectasis and Beta blocker use..and one C= CHF  ? All Acid reflux related > try aggressive rx and off nsaids

## 2011-12-03 NOTE — Assessment & Plan Note (Signed)
Although there are clearly abnormalities on CT scan, they should probably be considered "microscopic" since not obvious on plain cxr .     In the setting of obvious "macroscopic" health issues,  I am very reluctatnt to embark on an invasive w/u at this point but will arrange consevative  follow up and in the meantime see what we can do to address the patient's subjective concerns which have nothing to do with the tiny nodules.  See instructions for specific recommendations which were reviewed directly with the patient who was given a copy with highlighter outlining the key components.

## 2011-12-04 ENCOUNTER — Institutional Professional Consult (permissible substitution): Payer: PRIVATE HEALTH INSURANCE | Admitting: Internal Medicine

## 2011-12-05 ENCOUNTER — Encounter: Payer: Self-pay | Admitting: Family Medicine

## 2011-12-05 ENCOUNTER — Ambulatory Visit (INDEPENDENT_AMBULATORY_CARE_PROVIDER_SITE_OTHER): Payer: PRIVATE HEALTH INSURANCE | Admitting: Family Medicine

## 2011-12-05 VITALS — BP 112/70 | HR 76 | Resp 16 | Ht 68.0 in | Wt 144.0 lb

## 2011-12-05 DIAGNOSIS — F411 Generalized anxiety disorder: Secondary | ICD-10-CM

## 2011-12-05 DIAGNOSIS — R1310 Dysphagia, unspecified: Secondary | ICD-10-CM | POA: Insufficient documentation

## 2011-12-05 DIAGNOSIS — R918 Other nonspecific abnormal finding of lung field: Secondary | ICD-10-CM

## 2011-12-05 DIAGNOSIS — F419 Anxiety disorder, unspecified: Secondary | ICD-10-CM

## 2011-12-05 DIAGNOSIS — K219 Gastro-esophageal reflux disease without esophagitis: Secondary | ICD-10-CM

## 2011-12-05 MED ORDER — ALPRAZOLAM 1 MG PO TABS: 1.0000 mg | ORAL_TABLET | Freq: Every evening | ORAL | Status: DC | PRN

## 2011-12-05 NOTE — Assessment & Plan Note (Addendum)
She does have some anxiety and tends to worry a lot,keeping her up at night . I will continue her xanax at bedtime, placed on substance contract today  As I do not want to overwhelm her with too many things today, we will refer to GI,she seemed overwhelmed with having to see another physician On follow-up visits we will discuss getting another Mammogram , lipid panel etc

## 2011-12-05 NOTE — Assessment & Plan Note (Signed)
Referral to GI  

## 2011-12-05 NOTE — Progress Notes (Signed)
  Subjective:    Patient ID: Tracey Fox, female    DOB: 05-17-1955, 56 y.o.   MRN: [id number]  HPI Patient presents to establish care. She has not had a PCP in many years.  Medications and history reviewed She was evaluated in the emergency room secondary to ongoing chest pain for one month. Her d-dimer was elevated prompting a CT angiogram of chest. CT of chest showed pulmonary nodules as well as thickening of the esophagus. She was then referred to pulmonology. Pulmonology noted that the nodules were not worrisome at this time and she was started on PPI for GERD with esophageal thickening noted on the CT scan. She tells me today she has some difficulty swallowing certain foods. This is been going on for many months but is worsening. She's never had EGD or colonoscopy. She also has a history of anxiety. She was prescribed Xanax 1 mg at bedtime by the emergency room. Her mother passed in April of this year. She is currently separated from her husband. She lives with one of her children and her brother in [place]. She has some older children who have problems with drug addiction whom she is trying to help him find this to be very stressful as well. She works for a [employer] in [place]. Last Mammogram 2010 S/P hysterectomy She's been having hot flashes which have increased over the past year she also is fatigued at times. She does not want to try hormone therapy but is looking for some relief.     Review of Systems - per above  GEN- denies fatigue, fever, weight loss,weakness, recent illness HEENT- denies eye drainage, change in vision, nasal discharge, CVS- denies chest pain, palpitations RESP- denies SOB, cough, wheeze ABD- denies N/V, change in stools, abd pain GU- denies dysuria, hematuria, dribbling, incontinence MSK- denies joint pain, muscle aches, injury Neuro- denies headache, dizziness, syncope, seizure activity      Objective:   Physical Exam GEN- NAD, alert and oriented x3 HEENT- PERRL, EOMI, non injected sclera, pink conjunctiva, MMM, oropharynx clear Neck- Supple,  CVS- RRR, no murmur RESP-CTAB ABD-NABS,soft,NT,ND EXT- No edema Pulses- Radial, DP- 2+ Psych-fair eye contact, normal affect, not overly anxious appearing, soft spoken        Assessment & Plan:

## 2011-12-05 NOTE — Patient Instructions (Signed)
Estroven once a day  Use the xanax at bedtime  I will refer you to Gastroenterology for your swallowing  F/U 2 months

## 2011-12-05 NOTE — Assessment & Plan Note (Signed)
Continue PPI, Referral to GI for Endoscopy with esophageal thickening on scan

## 2011-12-11 ENCOUNTER — Telehealth: Payer: Self-pay | Admitting: Internal Medicine

## 2011-12-11 NOTE — Telephone Encounter (Signed)
Member ID # [id number]. Per PA rep at [facility], they are faxing over a form. We are unable to do PA over the phone. Will await fax.  Still waiting on fax. Will forward to triage while waiting on form.

## 2011-12-13 NOTE — Telephone Encounter (Signed)
I called insurance again and requested they send fax to triage fax. Will await fax. Carron Curie, CMA

## 2011-12-14 NOTE — Telephone Encounter (Signed)
I called insurance company again and advised again that we have not received the fax. Rep stated it may their computer system so she will print the form and fax it manually. Will await fax. Carron Curie, CMA  Fax received and completed and placed in MW look-at. Carron Curie, CMA

## 2012-01-04 MED ORDER — PANTOPRAZOLE SODIUM 40 MG PO TBEC: 40.0000 mg | DELAYED_RELEASE_TABLET | Freq: Every day | ORAL | Status: DC

## 2012-01-04 NOTE — Telephone Encounter (Signed)
Returning call can be reached at [phone].Tracey Fox

## 2012-01-04 NOTE — Telephone Encounter (Signed)
Called [facility] [employer] to check status of PA.  Nexium was denied and they are re faxing denial letter with list of meds she has to try and fail first Will await fax and place in [facility] lookat once received.

## 2012-01-04 NOTE — Telephone Encounter (Signed)
Spoke with pt and notified of recs per MW. She is okay with trying the pantoprazole and I have sent rx to her pharm.

## 2012-01-04 NOTE — Telephone Encounter (Signed)
LMTCB

## 2012-01-04 NOTE — Telephone Encounter (Signed)
Form with alternatives for nexium in your lookat. Please advise thanks

## 2012-01-04 NOTE — Telephone Encounter (Signed)
Done - she will need to try  pantoprazole 40 mg daily x one month (ok to call in rx)

## 2012-02-05 ENCOUNTER — Encounter: Payer: Self-pay | Admitting: Family Medicine

## 2012-02-05 ENCOUNTER — Ambulatory Visit (INDEPENDENT_AMBULATORY_CARE_PROVIDER_SITE_OTHER): Payer: PRIVATE HEALTH INSURANCE | Admitting: Family Medicine

## 2012-02-05 VITALS — BP 118/60 | HR 74 | Resp 18 | Ht 68.0 in | Wt 147.0 lb

## 2012-02-05 DIAGNOSIS — Z1239 Encounter for other screening for malignant neoplasm of breast: Secondary | ICD-10-CM

## 2012-02-05 DIAGNOSIS — R0989 Other specified symptoms and signs involving the circulatory and respiratory systems: Secondary | ICD-10-CM

## 2012-02-05 DIAGNOSIS — F411 Generalized anxiety disorder: Secondary | ICD-10-CM

## 2012-02-05 DIAGNOSIS — Z1321 Encounter for screening for nutritional disorder: Secondary | ICD-10-CM

## 2012-02-05 DIAGNOSIS — Z13220 Encounter for screening for lipoid disorders: Secondary | ICD-10-CM

## 2012-02-05 DIAGNOSIS — F419 Anxiety disorder, unspecified: Secondary | ICD-10-CM

## 2012-02-05 DIAGNOSIS — R0609 Other forms of dyspnea: Secondary | ICD-10-CM

## 2012-02-05 DIAGNOSIS — Z130 Encounter for screening for diseases of the blood and blood-forming organs and certain disorders involving the immune mechanism: Secondary | ICD-10-CM

## 2012-02-05 DIAGNOSIS — K219 Gastro-esophageal reflux disease without esophagitis: Secondary | ICD-10-CM

## 2012-02-05 DIAGNOSIS — R0600 Dyspnea, unspecified: Secondary | ICD-10-CM

## 2012-02-05 DIAGNOSIS — R1310 Dysphagia, unspecified: Secondary | ICD-10-CM

## 2012-02-05 DIAGNOSIS — R062 Wheezing: Secondary | ICD-10-CM

## 2012-02-05 DIAGNOSIS — Z1211 Encounter for screening for malignant neoplasm of colon: Secondary | ICD-10-CM

## 2012-02-05 MED ORDER — ALPRAZOLAM 1 MG PO TABS: 1.0000 mg | ORAL_TABLET | Freq: Every evening | ORAL | Status: DC | PRN

## 2012-02-05 MED ORDER — PANTOPRAZOLE SODIUM 40 MG PO TBEC: 40.0000 mg | DELAYED_RELEASE_TABLET | Freq: Every day | ORAL | Status: DC

## 2012-02-05 NOTE — Patient Instructions (Signed)
Mammogram to be set up Referral to Dr. Karilyn Cota  PFT to be scheduled for breathing Get the labs done fasting  F/U 6 months

## 2012-02-06 ENCOUNTER — Encounter: Payer: Self-pay | Admitting: Family Medicine

## 2012-02-06 NOTE — Assessment & Plan Note (Signed)
We will continue Xanax at this time at bedtime.

## 2012-02-06 NOTE — Assessment & Plan Note (Addendum)
Doing well on protonix. her insurance would not except Nexium

## 2012-02-06 NOTE — Assessment & Plan Note (Signed)
Referral to Dr. Dionicia Abler for EGD and colonoscopy, continue protonix

## 2012-02-06 NOTE — Progress Notes (Signed)
  Subjective:    Patient ID: Tracey Fox, female    DOB: 04/08/1956, 56 y.o.   MRN: [id number]  HPI  Patient presents to follow chronic medical problems. She continues to have difficulty with her swallowing- meats get stuck and he has choking episodes with liquids and would like to see gastroenterology. She's also due for colonoscopy in his agreement to this. She is due for mammogram and agrees to have this scheduled as well as Her anxiety has improved with the use of Xanax at bedtime. She is weary of using medications during the day because of mental clarity. Most of her anxiety and stress a surrounding her family her brother recently moved back in with her and is stressed her out because she provides her him financially and she will like to have her home to herself.   Review of Systems   GEN- denies fatigue, fever, weight loss,weakness, recent illness HEENT- denies eye drainage, change in vision, nasal discharge, CVS- denies chest pain, palpitations RESP- denies SOB, cough, +wheeze ABD- denies N/V, change in stools, abd pain GU- denies dysuria, hematuria, dribbling, incontinence MSK- denies joint pain, muscle aches, injury Neuro- denies headache, dizziness, syncope, seizure activity      Objective:   Physical Exam GEN- NAD, alert and oriented x3 HEENT- PERRL, EOMI, non injected sclera, pink conjunctiva, MMM, oropharynx clear Neck- Supple,  CVS- RRR, no murmur RESP-bilateral expiratory wheeze- scattered, normal WOB, no rhonchi ABD-NABS,soft,NT,ND EXT- No edema Pulses- Radial 2+ Psych- normal affect, not overly anxious appearing, soft spoken , not depressed appearing         Assessment & Plan:

## 2012-02-06 NOTE — Assessment & Plan Note (Signed)
She's been seen by pulmonary. She has these episodes of wheezing however no cough. She does get short of breath when exerting herself. She had a pulmonary nodule that was found on CT scan after evaluation in the ER for shortness of breath and chest pain a few months ago. She's a nonsmoker. I will set her up for pulmonary function test

## 2012-02-19 ENCOUNTER — Telehealth: Payer: Self-pay

## 2012-02-19 NOTE — Telephone Encounter (Signed)
Noted  

## 2012-03-04 ENCOUNTER — Encounter (HOSPITAL_COMMUNITY): Payer: PRIVATE HEALTH INSURANCE

## 2012-03-07 ENCOUNTER — Other Ambulatory Visit: Payer: Self-pay | Admitting: Family Medicine

## 2012-03-07 DIAGNOSIS — Z09 Encounter for follow-up examination after completed treatment for conditions other than malignant neoplasm: Secondary | ICD-10-CM

## 2012-03-12 ENCOUNTER — Encounter (INDEPENDENT_AMBULATORY_CARE_PROVIDER_SITE_OTHER): Payer: Self-pay | Admitting: Internal Medicine

## 2012-03-12 ENCOUNTER — Ambulatory Visit (HOSPITAL_COMMUNITY)
Admission: RE | Admit: 2012-03-12 | Discharge: 2012-03-12 | Disposition: A | Discharge status: Home / Self Care | Payer: 59 | Source: Ambulatory Visit | Attending: Family Medicine | Admitting: Family Medicine

## 2012-03-12 ENCOUNTER — Ambulatory Visit (INDEPENDENT_AMBULATORY_CARE_PROVIDER_SITE_OTHER): Payer: PRIVATE HEALTH INSURANCE | Admitting: Internal Medicine

## 2012-03-12 VITALS — BP 102/68 | HR 60 | Temp 97.4°F | Ht 68.0 in | Wt 145.7 lb

## 2012-03-12 DIAGNOSIS — R1310 Dysphagia, unspecified: Secondary | ICD-10-CM

## 2012-03-12 DIAGNOSIS — R062 Wheezing: Secondary | ICD-10-CM | POA: Insufficient documentation

## 2012-03-12 NOTE — Progress Notes (Signed)
Subjective:     Patient ID: Tracey Fox, female   DOB: [birth year], 56 y.o.   MRN: [id number]  HPI  Referred to our office for dysphagia. Drinks and foods feel like they are lodging.  Meats have lodged in her esophagus.  Symptoms x 1 yr. Sodas will sometimes lodge. Appetite is good. No unintentional weight loss.  No chest pain. Her BMs are normal Usually has one a day. No melena or bright red rectal bleeding. She has never undergone a colonoscopy in the past.  Review of Systems Current Outpatient Prescriptions  Medication Sig Dispense Refill  . ALPRAZolam (XANAX) 1 MG tablet Take 1 tablet (1 mg total) by mouth at bedtime as needed. For sleep  30 tablet  2  . pantoprazole (PROTONIX) 40 MG tablet Take 1 tablet (40 mg total) by mouth daily.  30 tablet  6   Past Medical History  Diagnosis Date  . Anxiety   . GERD (gastroesophageal reflux disease)    Past Surgical History  Procedure Date  . Abdominal hysterectomy   . Breast surgery   . Cholecystectomy    History   Social History  . Marital Status: Married    Spouse Name: N/A    Number of Children: 4  . Years of Education: N/A   Occupational History  . office manager    Social History Main Topics  . Smoking status: Never Smoker   . Smokeless tobacco: Never Used  . Alcohol Use: No  . Drug Use: No  . Sexually Active: Not on file   Other Topics Concern  . Not on file   Social History Narrative  . No narrative on file   Family Status  Relation Status Death Age  . Father Deceased 81    heart attack  . Mother Deceased 13    sepsis  . Sister Deceased 60    breast cancer  . Brother Alive   . Brother Alive   . Brother Alive    No Known Allergies     Objective:   Physical Exam Filed Vitals:   03/12/12 1459  BP: 102/68  Pulse: 60  Temp: 97.4 F (36.3 C)  Height: 5\' 8"  (1.727 m)  Weight: 145 lb 11.2 oz (66.089 kg)   Alert and oriented. Skin warm and dry. Oral mucosa is moist.   . Sclera anicteric,  conjunctivae is pink. Thyroid not enlarged. No cervical lymphadenopathy. Lungs clear. Heart regular rate and rhythm.  Abdomen is soft. Bowel sounds are positive. No hepatomegaly. No abdominal masses felt. No tenderness.  No edema to lower extremities.     Assessment:    Dysphagia to solids and liquids. Esophageal stricture/web needs to be ruled out.   Plan:    Barium pill study. Will probably need an EGD/ED She has declined a colonoscopy today.

## 2012-03-12 NOTE — Patient Instructions (Addendum)
Esophagram. Probably need EGDED

## 2012-03-20 ENCOUNTER — Ambulatory Visit (HOSPITAL_COMMUNITY)
Admission: RE | Admit: 2012-03-20 | Discharge: 2012-03-20 | Disposition: A | Discharge status: Home / Self Care | Payer: 59 | Source: Ambulatory Visit | Attending: Internal Medicine | Admitting: Internal Medicine

## 2012-03-20 ENCOUNTER — Ambulatory Visit (HOSPITAL_COMMUNITY)
Admission: RE | Admit: 2012-03-20 | Discharge: 2012-03-20 | Disposition: A | Discharge status: Home / Self Care | Payer: 59 | Source: Ambulatory Visit | Attending: Family Medicine | Admitting: Family Medicine

## 2012-03-20 ENCOUNTER — Other Ambulatory Visit (HOSPITAL_COMMUNITY): Payer: Self-pay | Admitting: Internal Medicine

## 2012-03-20 ENCOUNTER — Other Ambulatory Visit (INDEPENDENT_AMBULATORY_CARE_PROVIDER_SITE_OTHER): Payer: Self-pay | Admitting: Internal Medicine

## 2012-03-20 DIAGNOSIS — R1310 Dysphagia, unspecified: Secondary | ICD-10-CM

## 2012-03-20 DIAGNOSIS — Z09 Encounter for follow-up examination after completed treatment for conditions other than malignant neoplasm: Secondary | ICD-10-CM

## 2012-03-21 NOTE — Progress Notes (Signed)
lmom asking patient to call me so I can schedule EGD/ED

## 2012-03-27 NOTE — Procedures (Signed)
Tracey Fox, Tracey Fox                ACCOUNT NO.:  [id number]  MEDICAL RECORD NO.:  [id number]  LOCATION:                                 FACILITY:  PHYSICIAN:  Benjaman Artman L. Juanetta Gosling, M.D.DATE OF BIRTH:  13-Sep-1955  DATE OF PROCEDURE:  03/26/2012 DATE OF DISCHARGE:                           PULMONARY FUNCTION TEST   Reason for pulmonary function testing is wheezing. 1. Spirometry shows no ventilatory defect and no evidence of airflow     obstruction. 2. Lung volumes showed normal total lung capacity and somewhat     increased residual volume which suggest air trapping. 3. DLCO is mildly reduced, but please note the technician's comments     about a mouth air pressure. 4. Airway resistance is normal. 5. No definite cause of wheezing is seen on this pulmonary function     test.     Ramon Dredge L. Juanetta Gosling, M.D.     EL[facility]/MEDQ  D:  03/26/2012  T:  03/26/2012  Job:  [id number]  cc:   Milinda Antis, MD

## 2012-03-28 ENCOUNTER — Encounter (INDEPENDENT_AMBULATORY_CARE_PROVIDER_SITE_OTHER): Payer: Self-pay | Admitting: *Deleted

## 2012-03-28 NOTE — Progress Notes (Signed)
Please let pt know her lung test was normal, no asthma or COPD seen. Good news

## 2012-04-05 ENCOUNTER — Telehealth: Payer: Self-pay | Admitting: *Deleted

## 2012-04-05 NOTE — Telephone Encounter (Signed)
Message copied by Christen Butter on Fri Apr 05, 2012  5:18 PM ------      Message from: Sandrea Hughs B      Created: Wed Nov 29, 2011 11:12 AM       Needs f/u cxr by now

## 2012-04-05 NOTE — Telephone Encounter (Signed)
Needs cxr in Dec to f/u pulmonary nodule LMTCB

## 2012-04-08 NOTE — Telephone Encounter (Signed)
Pt returned call. [phone]. Tracey Fox

## 2012-04-08 NOTE — Telephone Encounter (Signed)
Spoke with pt and notified due for ov with cxr Appt was scheduled for 04/29/12

## 2012-04-10 LAB — PULMONARY FUNCTION TEST

## 2012-04-29 ENCOUNTER — Ambulatory Visit: Payer: PRIVATE HEALTH INSURANCE | Admitting: Internal Medicine

## 2012-05-13 ENCOUNTER — Other Ambulatory Visit: Payer: Self-pay | Admitting: Family Medicine

## 2012-08-05 ENCOUNTER — Ambulatory Visit (INDEPENDENT_AMBULATORY_CARE_PROVIDER_SITE_OTHER): Payer: PRIVATE HEALTH INSURANCE | Admitting: Family Medicine

## 2012-08-05 ENCOUNTER — Encounter: Payer: Self-pay | Admitting: Family Medicine

## 2012-08-05 VITALS — BP 102/62 | HR 62 | Resp 18 | Ht 68.0 in | Wt 147.0 lb

## 2012-08-05 DIAGNOSIS — F4320 Adjustment disorder, unspecified: Secondary | ICD-10-CM | POA: Insufficient documentation

## 2012-08-05 DIAGNOSIS — F419 Anxiety disorder, unspecified: Secondary | ICD-10-CM

## 2012-08-05 DIAGNOSIS — F4321 Adjustment disorder with depressed mood: Secondary | ICD-10-CM

## 2012-08-05 DIAGNOSIS — F411 Generalized anxiety disorder: Secondary | ICD-10-CM

## 2012-08-05 MED ORDER — ALPRAZOLAM 1 MG PO TABS: 1.0000 mg | ORAL_TABLET | Freq: Two times a day (BID) | ORAL | Status: DC | PRN

## 2012-08-05 MED ORDER — FLUOXETINE HCL 10 MG PO TABS: 10.0000 mg | ORAL_TABLET | Freq: Every day | ORAL | Status: DC

## 2012-08-05 NOTE — Progress Notes (Signed)
  Subjective:    Patient ID: Tracey Fox, female    DOB: June 05, 1955, 57 y.o.   MRN: [id number]  HPI  Patient presents to followup anxiety  her brother committed suicide 3 weeks ago it she's having a lot of difficulty with this. This is a brother whom she lived with him was the most close. She also tells me about 4 months ago she lost her grandson to child protective services as her daughter was found to be an unfit parent. The first week of April will be one year since her mother died. She feels like she has no control over her life right now. She has taken a couple days off of work but needs to work and she is did provide some distraction for her. She is unable to sleep and often takes 1-2 Xanax at bedtime to sleep. She has many crying episodes and therefore has called and [facility] She feels like there was something she could of been to prevent her brothers suicide.  Review of Systems - per above      Objective:   Physical Exam   GEN- NAD, alert and oriented x3 Psych- depressed and crying, no SI, no hallucinations, normal thought process       Assessment & Plan:

## 2012-08-05 NOTE — Patient Instructions (Signed)
Start the prozac once a day Try the xanax 1/2 tablet during the day or take 1-2 tablets at bedtime F/U 2 weeks

## 2012-08-05 NOTE — Assessment & Plan Note (Signed)
Her grief is more substantial due to anniversary of mother's lost and her grandson Therapy has been arranged Start prozac 10mg 

## 2012-08-05 NOTE — Assessment & Plan Note (Signed)
Increase xanax to 1 -2 tas at bedtime,she can also try 1/2 tablet in AM , until prozac kicks in

## 2012-11-05 ENCOUNTER — Other Ambulatory Visit: Payer: Self-pay | Admitting: Family Medicine

## 2012-11-05 NOTE — Telephone Encounter (Signed)
?  ok to refill °

## 2012-11-05 NOTE — Telephone Encounter (Signed)
Okay to refill, with 1 extra

## 2012-11-06 NOTE — Telephone Encounter (Signed)
Med called in

## 2012-11-24 ENCOUNTER — Emergency Department (INDEPENDENT_AMBULATORY_CARE_PROVIDER_SITE_OTHER): Payer: PRIVATE HEALTH INSURANCE

## 2012-11-24 ENCOUNTER — Encounter (HOSPITAL_COMMUNITY): Payer: Self-pay | Admitting: *Deleted

## 2012-11-24 ENCOUNTER — Emergency Department (INDEPENDENT_AMBULATORY_CARE_PROVIDER_SITE_OTHER)
Admission: EM | Admit: 2012-11-24 | Discharge: 2012-11-24 | Disposition: A | Discharge status: Home / Self Care | Payer: PRIVATE HEALTH INSURANCE | Source: Home / Self Care

## 2012-11-24 DIAGNOSIS — F411 Generalized anxiety disorder: Secondary | ICD-10-CM

## 2012-11-24 DIAGNOSIS — R5381 Other malaise: Secondary | ICD-10-CM

## 2012-11-24 DIAGNOSIS — K219 Gastro-esophageal reflux disease without esophagitis: Secondary | ICD-10-CM

## 2012-11-24 DIAGNOSIS — R5383 Other fatigue: Secondary | ICD-10-CM

## 2012-11-24 DIAGNOSIS — F419 Anxiety disorder, unspecified: Secondary | ICD-10-CM

## 2012-11-24 DIAGNOSIS — S30860A Insect bite (nonvenomous) of lower back and pelvis, initial encounter: Secondary | ICD-10-CM

## 2012-11-24 DIAGNOSIS — M2550 Pain in unspecified joint: Secondary | ICD-10-CM

## 2012-11-24 DIAGNOSIS — J9801 Acute bronchospasm: Secondary | ICD-10-CM

## 2012-11-24 DIAGNOSIS — W57XXXA Bitten or stung by nonvenomous insect and other nonvenomous arthropods, initial encounter: Secondary | ICD-10-CM

## 2012-11-24 LAB — POCT I-STAT, CHEM 8
Creatinine, Ser: 0.8 mg/dL (ref 0.50–1.10)
HCT: 45 % (ref 36.0–46.0)
Hemoglobin: 15.3 g/dL — ABNORMAL HIGH (ref 12.0–15.0)
Potassium: 3.8 mEq/L (ref 3.5–5.1)
Sodium: 143 mEq/L (ref 135–145)
TCO2: 28 mmol/L (ref 0–100)

## 2012-11-24 LAB — POCT URINALYSIS DIP (DEVICE)
Bilirubin Urine: NEGATIVE
Glucose, UA: NEGATIVE mg/dL
Ketones, ur: NEGATIVE mg/dL
Leukocytes, UA: NEGATIVE
Specific Gravity, Urine: 1.01 (ref 1.005–1.030)

## 2012-11-24 MED ORDER — DOXYCYCLINE HYCLATE 100 MG PO CAPS: 100.0000 mg | ORAL_CAPSULE | Freq: Two times a day (BID) | ORAL | Status: DC

## 2012-11-24 MED ORDER — ALBUTEROL SULFATE (5 MG/ML) 0.5% IN NEBU
INHALATION_SOLUTION | RESPIRATORY_TRACT | Status: AC
  Filled 2012-11-24: qty 0.5

## 2012-11-24 MED ORDER — ALBUTEROL SULFATE HFA 108 (90 BASE) MCG/ACT IN AERS: 2.0000 | INHALATION_SPRAY | RESPIRATORY_TRACT | Status: DC | PRN

## 2012-11-24 MED ORDER — IPRATROPIUM BROMIDE 0.02 % IN SOLN
0.5000 mg | Freq: Once | RESPIRATORY_TRACT | Status: AC
  Administered 2012-11-24: 0.5 mg via RESPIRATORY_TRACT

## 2012-11-24 MED ORDER — ALBUTEROL SULFATE (5 MG/ML) 0.5% IN NEBU
5.0000 mg | INHALATION_SOLUTION | Freq: Once | RESPIRATORY_TRACT | Status: AC
  Administered 2012-11-24: 5 mg via RESPIRATORY_TRACT

## 2012-11-24 NOTE — ED Notes (Signed)
Patient receiving breathing treatment unable to transport to x-ray

## 2012-11-24 NOTE — ED Notes (Signed)
Patient complains of chills, joint pain, fatigue, diarrhea, nausea. Denies vomiting.

## 2012-11-24 NOTE — ED Provider Notes (Signed)
History    CSN: [id number] Arrival date & time 11/24/12  1141  First MD Initiated Contact with Patient 11/24/12 1203     Chief Complaint  Patient presents with  . Insect Bite   (Consider location/radiation/quality/duration/timing/severity/associated sxs/prior Treatment) HPI Comments: 57 year old female presents with a chief complaint of fatigue, chills, nausea but no vomiting, diarrhea, joint pain primarily involving the low back in the knees. She denies headache, upper respiratory congestion, shortness of breath. She does state that 4 days ago she developed substernal heaviness that lasted for approximately 30 minutes. He has not reoccurred. She removed and attached small tick from her left lower anterior chest approximately 2 weeks ago.  Past Medical History  Diagnosis Date  . Anxiety   . GERD (gastroesophageal reflux disease)    Past Surgical History  Procedure Laterality Date  . Abdominal hysterectomy    . Breast surgery    . Cholecystectomy     Family History  Problem Relation Age of Onset  . Heart disease Father   . COPD Brother     oldest brother  . Breast cancer Sister    History  Substance Use Topics  . Smoking status: Never Smoker   . Smokeless tobacco: Never Used  . Alcohol Use: No   OB History   Grav Para Term Preterm Abortions TAB SAB Ect Mult Living                 Review of Systems  Constitutional: Positive for chills, activity change and fatigue. Negative for fever.  HENT: Negative.   Respiratory: Positive for chest tightness. Negative for shortness of breath.   Cardiovascular: Positive for chest pain. Negative for palpitations.  Gastrointestinal: Negative for nausea, vomiting and abdominal pain.  Genitourinary: Negative.   Musculoskeletal: Negative for gait problem.  Skin: Negative for rash and wound.  Neurological: Positive for weakness. Negative for tremors and headaches.    Allergies  Review of patient's allergies indicates no known  allergies.  Home Medications   Current Outpatient Rx  Name  Route  Sig  Dispense  Refill  . ALPRAZolam (XANAX) 1 MG tablet      TAKE 1 TABLET BY MOUTH TWICE DAILY AS NEEDED FOR SLEEP   60 tablet   1   . FLUoxetine (PROZAC) 10 MG tablet   Oral   Take 1 tablet (10 mg total) by mouth daily.   30 tablet   1   . pantoprazole (PROTONIX) 40 MG tablet   Oral   Take 1 tablet (40 mg total) by mouth daily.   30 tablet   6    BP 110/68  Pulse 51  Temp(Src) 98.3 F (36.8 C) (Oral)  Resp 16  SpO2 98% Physical Exam  Nursing note and vitals reviewed. Constitutional: She is oriented to person, place, and time. She appears well-developed and well-nourished. No distress.  She is examined while sitting on the exam table. Her speech is low volume, soft and slow.  HENT:  Mouth/Throat: Oropharynx is clear and moist. No oropharyngeal exudate.  Bilateral TMs are normal.  Eyes: Conjunctivae and EOM are normal.  Neck: Normal range of motion. Neck supple.  Cardiovascular: Normal rate, regular rhythm and normal heart sounds.   Pulmonary/Chest: Effort normal. She has wheezes.  Diffuse bilateral expiratory wheeze. Diminished expiratory breath sounds and prolonged expiratory phase. Rare inspiratory wheeze.  Abdominal: Soft. She exhibits no distension. There is no tenderness. There is no rebound.  Musculoskeletal: She exhibits no edema and no tenderness.  Lymphadenopathy:  She has no cervical adenopathy.  Neurological: She is alert and oriented to person, place, and time. She exhibits normal muscle tone.  Skin: Skin is warm and dry. No rash noted. No erythema.    ED Course  Procedures (including critical care time) Labs Reviewed - No data to display No results found. No diagnosis found.  MDM  1400h: Post neb her lungs are much clearer. Rare inspiratory wheeze but no expiratory wheezes. Chest good air movement diffusely and bilaterally. Good chest expansion. During this reevaluation she  had states she just been under a whole lot of stress lately and feels worn out. The chem 8 test was within normal limits. There is no sign of anemia. Urinalysis is within normal limits Chest x-ray showing 0.5 cm nodule as prior  exam with no change. Otherwise unremarkable. EKG normal sinus rhythm with no ischemic changes. The only objective finding is expiratory wheezing. She has no history of smoking, asthma or known reactive airway disease. She does have a strong history of anxiety and possibly depression. She has been on several antianxiety medications including benzodiazepines and SSRIs. During the interviews she has somewhat of a depressed affect and admitting that she has been under a lot of stress lately. I suspect that she has some emotional overlay which may be exaggerating some of her symptoms. She did admit that she pulled a small tick off approximately 2 weeks ago. I will obtain a Lyme disease in [place] spotted fever titer and go ahead and treat with doxycycline. She is to followup with her primary care doctor next week for further evaluation and possible additional lab work. If there is any worsening new symptoms or problems development of rash, fever or other symptoms she is to go to the emergency department promptly. Patient is discharged in a stable and improved condition.  Hayden Rasmussen, NP 11/24/12 1445

## 2012-11-25 LAB — B. BURGDORFI ANTIBODIES: B burgdorferi Ab IgG+IgM: 0.42 {ISR}

## 2012-11-25 NOTE — ED Provider Notes (Signed)
Medical screening examination/treatment/procedure(s) were performed by resident physician or non-physician practitioner and as supervising physician I was immediately available for consultation/collaboration.   Aldyn Toon DOUGLAS MD.   Aryona Sill D Rayah Fines, MD 11/25/12 1340 

## 2012-11-26 LAB — ROCKY MTN SPOTTED FVR AB, IGG-BLOOD: RMSF IgG: 0.23 IV

## 2012-11-26 LAB — ROCKY MTN SPOTTED FVR AB, IGM-BLOOD: RMSF IgM: 0.23 IV (ref 0.00–0.89)

## 2012-11-28 ENCOUNTER — Telehealth (HOSPITAL_COMMUNITY): Payer: Self-pay | Admitting: *Deleted

## 2012-11-28 NOTE — ED Notes (Signed)
Pt. verified  X 2.  Pt. notified that her Lyme's disease and RMSF tests were neg.,  as directed by Hayden Rasmussen NP.  Pt. thanked me for calling. Vassie Moselle 11/28/2012

## 2013-01-01 ENCOUNTER — Encounter: Payer: Self-pay | Admitting: Family Medicine

## 2013-01-01 ENCOUNTER — Other Ambulatory Visit: Payer: Self-pay | Admitting: Family Medicine

## 2013-01-01 ENCOUNTER — Ambulatory Visit (INDEPENDENT_AMBULATORY_CARE_PROVIDER_SITE_OTHER): Payer: PRIVATE HEALTH INSURANCE | Admitting: Family Medicine

## 2013-01-01 VITALS — BP 120/68 | HR 54 | Temp 97.2°F | Ht 67.0 in | Wt 144.0 lb

## 2013-01-01 DIAGNOSIS — F411 Generalized anxiety disorder: Secondary | ICD-10-CM

## 2013-01-01 DIAGNOSIS — K219 Gastro-esophageal reflux disease without esophagitis: Secondary | ICD-10-CM

## 2013-01-01 DIAGNOSIS — R918 Other nonspecific abnormal finding of lung field: Secondary | ICD-10-CM

## 2013-01-01 DIAGNOSIS — R062 Wheezing: Secondary | ICD-10-CM

## 2013-01-01 DIAGNOSIS — F419 Anxiety disorder, unspecified: Secondary | ICD-10-CM

## 2013-01-01 MED ORDER — ALPRAZOLAM 1 MG PO TABS: ORAL_TABLET | ORAL | Status: DC

## 2013-01-01 NOTE — Telephone Encounter (Signed)
?   OK to Refill  

## 2013-01-01 NOTE — Patient Instructions (Signed)
Continue current medication Fasting labs to be done  F/U 1 week with Lab  F/U 6 months

## 2013-01-01 NOTE — Telephone Encounter (Signed)
Faxed

## 2013-01-02 ENCOUNTER — Encounter: Payer: Self-pay | Admitting: Family Medicine

## 2013-01-02 DIAGNOSIS — R062 Wheezing: Secondary | ICD-10-CM | POA: Insufficient documentation

## 2013-01-02 NOTE — Assessment & Plan Note (Signed)
Declines treatment at this time, ? Could be related to her wheezing

## 2013-01-02 NOTE — Progress Notes (Signed)
  Subjective:    Patient ID: Tracey Fox, female    DOB: Oct 03, 1955, 57 y.o.   MRN: [id number]  HPI Pt here to f/u mood, stopped her prozac after 2 months. Had a lot of family support after death of her brother. Feels she is at a good place. Is still taking xanax for sleep. Stop meds for GERD and dysphagia, still has some episodes of food getting stuck but wants to defer on intervention at this time. Seen in UC last month for fatigue, wheezing, tick bite, given course of doxycycline. Now resolved, she does have albuterol but only uses   Review of Systems   GEN- denies fatigue, fever, weight loss,weakness, recent illness HEENT- denies eye drainage, change in vision, nasal discharge, CVS- denies chest pain, palpitations RESP- denies SOB, cough, wheeze ABD- denies N/V, change in stools, abd pain Neuro- denies headache, dizziness, syncope, seizure activity      Objective:   Physical Exam GEN- NAD, alert and oriented x3 HEENT- PERRL, EOMI, non injected sclera, pink conjunctiva, MMM, oropharynx clear Neck- Supple, CVS- RRR, no murmur RESP-few scattered wheeze ABD-NABS,soft,NT,ND,ND EXT- No edema Pulses- Radial 2+ Psych- flat affect, not anxious, no cyring, well groomed, good eye contact        Assessment & Plan:

## 2013-01-02 NOTE — Assessment & Plan Note (Signed)
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

## 2013-01-02 NOTE — Assessment & Plan Note (Signed)
Recent CXR shows stability of nodules

## 2013-01-02 NOTE — Assessment & Plan Note (Signed)
Off meds back at baseline, continue xanax for sleep

## 2013-01-07 ENCOUNTER — Encounter (INDEPENDENT_AMBULATORY_CARE_PROVIDER_SITE_OTHER): Payer: PRIVATE HEALTH INSURANCE

## 2013-01-07 DIAGNOSIS — Z833 Family history of diabetes mellitus: Secondary | ICD-10-CM

## 2013-01-07 DIAGNOSIS — Z0000 Encounter for general adult medical examination without abnormal findings: Secondary | ICD-10-CM

## 2013-01-07 DIAGNOSIS — Z13220 Encounter for screening for lipoid disorders: Secondary | ICD-10-CM

## 2013-01-07 LAB — COMPREHENSIVE METABOLIC PANEL
AST: 17 U/L (ref 0–37)
BUN: 10 mg/dL (ref 6–23)
CO2: 29 mEq/L (ref 19–32)
Calcium: 9.3 mg/dL (ref 8.4–10.5)
Chloride: 107 mEq/L (ref 96–112)
Creat: 0.64 mg/dL (ref 0.50–1.10)
Total Bilirubin: 1.1 mg/dL (ref 0.3–1.2)

## 2013-01-07 LAB — CBC WITH DIFFERENTIAL/PLATELET
Basophils Absolute: 0 10*3/uL (ref 0.0–0.1)
Eosinophils Absolute: 0.1 10*3/uL (ref 0.0–0.7)
Eosinophils Relative: 3 % (ref 0–5)
HCT: 40.4 % (ref 36.0–46.0)
Lymphocytes Relative: 37 % (ref 12–46)
MCH: 29.8 pg (ref 26.0–34.0)
MCHC: 34.4 g/dL (ref 30.0–36.0)
MCV: 86.7 fL (ref 78.0–100.0)
Monocytes Absolute: 0.3 10*3/uL (ref 0.1–1.0)
Platelets: 239 10*3/uL (ref 150–400)
RDW: 12.3 % (ref 11.5–15.5)
WBC: 4.1 10*3/uL (ref 4.0–10.5)

## 2013-01-07 LAB — LIPID PANEL
Cholesterol: 152 mg/dL (ref 0–200)
HDL: 43 mg/dL (ref >39)
Total CHOL/HDL Ratio: 3.5 Ratio
Triglycerides: 63 mg/dL (ref <150)

## 2013-01-07 LAB — HEMOGLOBIN A1C: Hgb A1c MFr Bld: 5.6 % (ref <5.7)

## 2013-01-07 NOTE — Progress Notes (Signed)
Patient ID: Tracey Fox, female   DOB: May 30, 1955, 57 y.o.   MRN: [id number] Error. Pt was to be here for fasting labs only Not an OV, no charge

## 2013-01-08 ENCOUNTER — Ambulatory Visit: Payer: PRIVATE HEALTH INSURANCE | Admitting: Family Medicine

## 2013-02-06 ENCOUNTER — Encounter (HOSPITAL_COMMUNITY): Payer: Self-pay

## 2013-02-06 ENCOUNTER — Emergency Department (HOSPITAL_COMMUNITY): Payer: 59

## 2013-02-06 ENCOUNTER — Emergency Department (HOSPITAL_COMMUNITY)
Admission: EM | Admit: 2013-02-06 | Discharge: 2013-02-06 | Disposition: A | Discharge status: Home / Self Care | Payer: 59 | Attending: Emergency Medicine | Admitting: Emergency Medicine

## 2013-02-06 DIAGNOSIS — R0600 Dyspnea, unspecified: Secondary | ICD-10-CM

## 2013-02-06 DIAGNOSIS — R0602 Shortness of breath: Secondary | ICD-10-CM | POA: Insufficient documentation

## 2013-02-06 DIAGNOSIS — F411 Generalized anxiety disorder: Secondary | ICD-10-CM | POA: Insufficient documentation

## 2013-02-06 DIAGNOSIS — R0989 Other specified symptoms and signs involving the circulatory and respiratory systems: Secondary | ICD-10-CM | POA: Insufficient documentation

## 2013-02-06 DIAGNOSIS — K219 Gastro-esophageal reflux disease without esophagitis: Secondary | ICD-10-CM | POA: Insufficient documentation

## 2013-02-06 DIAGNOSIS — R0609 Other forms of dyspnea: Secondary | ICD-10-CM | POA: Insufficient documentation

## 2013-02-06 DIAGNOSIS — R0789 Other chest pain: Secondary | ICD-10-CM | POA: Insufficient documentation

## 2013-02-06 DIAGNOSIS — Z7982 Long term (current) use of aspirin: Secondary | ICD-10-CM | POA: Insufficient documentation

## 2013-02-06 DIAGNOSIS — R079 Chest pain, unspecified: Secondary | ICD-10-CM

## 2013-02-06 DIAGNOSIS — Z79899 Other long term (current) drug therapy: Secondary | ICD-10-CM | POA: Insufficient documentation

## 2013-02-06 LAB — BASIC METABOLIC PANEL
CO2: 25 mEq/L (ref 19–32)
Chloride: 103 mEq/L (ref 96–112)
Glucose, Bld: 84 mg/dL (ref 70–99)
Potassium: 3.4 mEq/L — ABNORMAL LOW (ref 3.5–5.1)
Sodium: 139 mEq/L (ref 135–145)

## 2013-02-06 LAB — CBC WITH DIFFERENTIAL/PLATELET
Hemoglobin: 14.8 g/dL (ref 12.0–15.0)
Lymphocytes Relative: 35 % (ref 12–46)
Lymphs Abs: 1.8 10*3/uL (ref 0.7–4.0)
Neutrophils Relative %: 53 % (ref 43–77)
Platelets: 229 10*3/uL (ref 150–400)
RBC: 4.83 MIL/uL (ref 3.87–5.11)
WBC: 5.3 10*3/uL (ref 4.0–10.5)

## 2013-02-06 LAB — POCT I-STAT TROPONIN I: Troponin i, poc: 0 ng/mL (ref 0.00–0.08)

## 2013-02-06 MED ORDER — ASPIRIN 81 MG PO CHEW
324.0000 mg | CHEWABLE_TABLET | Freq: Once | ORAL | Status: AC
  Administered 2013-02-06: 324 mg via ORAL
  Filled 2013-02-06: qty 4

## 2013-02-06 MED ORDER — GI COCKTAIL ~~LOC~~
30.0000 mL | Freq: Once | ORAL | Status: AC
  Administered 2013-02-06: 30 mL via ORAL
  Filled 2013-02-06: qty 30

## 2013-02-06 MED ORDER — MORPHINE SULFATE 4 MG/ML IJ SOLN
4.0000 mg | Freq: Once | INTRAMUSCULAR | Status: AC
  Administered 2013-02-06: 4 mg via INTRAVENOUS
  Filled 2013-02-06: qty 1

## 2013-02-06 MED ORDER — LORAZEPAM 1 MG PO TABS
1.0000 mg | ORAL_TABLET | Freq: Once | ORAL | Status: AC
  Administered 2013-02-06: 1 mg via ORAL
  Filled 2013-02-06: qty 1

## 2013-02-06 MED ORDER — ASPIRIN 325 MG PO TABS
325.0000 mg | ORAL_TABLET | Freq: Once | ORAL | Status: AC
  Administered 2013-02-06: 325 mg via ORAL
  Filled 2013-02-06: qty 1

## 2013-02-06 MED ORDER — NITROGLYCERIN 0.4 MG SL SUBL
0.4000 mg | SUBLINGUAL_TABLET | SUBLINGUAL | Status: DC | PRN
  Administered 2013-02-06: 0.4 mg via SUBLINGUAL
  Filled 2013-02-06: qty 25

## 2013-02-06 NOTE — ED Notes (Signed)
Bed: NW29 Expected date:  Expected time:  Means of arrival:  Comments: Working on monitor

## 2013-02-06 NOTE — ED Notes (Signed)
Pt c/o intermittent, centralized chest pain x 5 days and shortness of breath x 1 month.  Pain score 5/10.  A & Ox4.  Sts SOB increases with activity.  Sts seen for SOB by PCP and told that she has a "spot" on lung.

## 2013-02-06 NOTE — ED Provider Notes (Signed)
CSN: [id number]     Arrival date & time 02/06/13  1053 History   First MD Initiated Contact with Patient 02/06/13 1114     Chief Complaint  Patient presents with  . Chest Pain  . Shortness of Breath   (Consider location/radiation/quality/duration/timing/severity/associated sxs/prior Treatment) HPI Comments: Patient presents today with a chief complaint of chest pain.  She describes the chest pain as pressure.  Pain located right anterior chest with no radiation.  She reports that the chest pain is associated with some SOB.  She reports that initially the chest pain was intermittent, but has been constant since 9 AM this morning.  She also reports that she has had some intermittent shortness of breath.  Shortness of breath worse with exertion.  She also reports that her symptoms become worse when she becomes anxious.  She does have a history of anxiety.  Patient was seen in the ED for chest pain in June of 2013.  She has a CT coronary arteries at that time, which showed a calcium score of zero.  She denies history of PT/DVT.  Denies prolonged travel or surgeries in the past 4 weeks.  Denies lower extremity edema or pain.  She is currently not on any estrogen containing medications.  She is does not smoke.  She denies any cardiac history aside from a "leaky mitral valve."  She denies history of HTN, Hyperlipidemia, or DM.  She reports that her father had a MI at the age of 81.  No other FH of cardiac disease.    The history is provided by the patient.    Past Medical History  Diagnosis Date  . Anxiety   . GERD (gastroesophageal reflux disease)    Past Surgical History  Procedure Laterality Date  . Abdominal hysterectomy    . Breast surgery    . Cholecystectomy     Family History  Problem Relation Age of Onset  . Heart disease Father   . COPD Brother     oldest brother  . Breast cancer Sister    History  Substance Use Topics  . Smoking status: Never Smoker   . Smokeless tobacco: Never  Used  . Alcohol Use: Yes     Comment: occ   OB History   Grav Para Term Preterm Abortions TAB SAB Ect Mult Living                 Review of Systems  Respiratory: Positive for shortness of breath.   Cardiovascular: Positive for chest pain.  All other systems reviewed and are negative.    Allergies  Review of patient's allergies indicates no known allergies.  Home Medications   Current Outpatient Rx  Name  Route  Sig  Dispense  Refill  . albuterol (PROVENTIL HFA;VENTOLIN HFA) 108 (90 BASE) MCG/ACT inhaler   Inhalation   Inhale 2 puffs into the lungs every 4 (four) hours as needed for wheezing.   1 Inhaler   0   . ALPRAZolam (XANAX) 1 MG tablet   Oral   Take 1 mg by mouth 2 (two) times daily as needed for anxiety.         Marland Kitchen aspirin EC 81 MG tablet   Oral   Take 81 mg by mouth daily.         . pantoprazole (PROTONIX) 40 MG tablet   Oral   Take 40 mg by mouth daily.          BP 122/63  Pulse 60  Temp(Src)  97.6 F (36.4 C) (Oral)  Resp 23  SpO2 99% Physical Exam  Nursing note and vitals reviewed. Constitutional: She appears well-developed and well-nourished.  HENT:  Head: Normocephalic and atraumatic.  Mouth/Throat: Oropharynx is clear and moist.  Neck: Normal range of motion. Neck supple.  Cardiovascular: Normal rate, regular rhythm, normal heart sounds and intact distal pulses.   Pulmonary/Chest: Effort normal and breath sounds normal. No respiratory distress. She has no wheezes. She has no rales. She exhibits no tenderness.  Abdominal: Soft. There is no tenderness.  Musculoskeletal:  No lower extremity edema  Neurological: She is alert.  Skin: Skin is warm and dry. No rash noted. She is not diaphoretic.  Psychiatric: She has a normal mood and affect.    ED Course  Procedures (including critical care time) Labs Review Labs Reviewed  BASIC METABOLIC PANEL - Abnormal; Notable for the following:    Potassium 3.4 (*)    All other components within  normal limits  CBC WITH DIFFERENTIAL  POCT I-STAT TROPONIN I   Imaging Review Dg Chest 2 View  02/06/2013   CLINICAL DATA:  Chest pain  EXAM: CHEST  2 VIEW  COMPARISON:  November 24, 2012  FINDINGS: There is no focal infiltrate, pulmonary edema, or pleural effusion. Stable 0.5 cm nodule is identified in the right upper lobe unchanged. The mediastinal contour and cardiac silhouette are stable. The soft tissues and osseous structures are stable.  IMPRESSION: No active cardiopulmonary disease.   Electronically Signed   By: Sherian Rein   On: 02/06/2013 12:31   Patient discussed with and also evaluated by Dr. Gwendolyn Grant.  Patient discussed with Dr. Jens Som with Cardiology.  He states that without seeing the patient it is difficult to make recommendations.  However, he feels that her chest pain sounds atypical and that her story does not sound impressive for cardiac disease.    MDM  No diagnosis found. Patient presenting with a chief complaint of chest pain and SOB.  No prior cardiac history aside from a "leaky mitral valve."  No history of HTN, DM, or Hyperlipidemia.  No ischemic changes on EKG.  Initial and 3 hour troponin negative.  Patient low risk for cardiac disease.  CT coronary arteries done in June of 2013 with a Calcium score of zero.  Aside from age the patient does not have risk factors for Pulmonary Embolism.  Chest pain is not pleuritic.  Therefore, do not feel that a d-dimer is indicated.  CXR negative.  Patient notices that pain becomes worse when she becomes anxious.  Therefore, pain could be brought on by anxiety.  Feel that the patient is stable for discharge.  Patient instructed to follow up with PCP to set up a cardiac stress test.  Patient in agreement with the plan.  Strict return precautions given.    Pascal Lux Tinsman, PA-C 02/08/13 [phone]

## 2013-02-08 NOTE — ED Provider Notes (Signed)
Medical screening examination/treatment/procedure(s) were conducted as a shared visit with non-physician practitioner(s) and myself.  I personally evaluated the patient during the encounter  Patient here with SOB, states progessively worsening dyspnea on exertion. Having CP here. Patient also states similar CP with her anxiety. No fevers. Aspirin given, EKG nonischemic. CCTA 1 year ago with calcium score of 0. Patient's delta troponins negative. Dr. Jens Som from Cards does not feel she meets Cardiac admission. With negative delta trop, recent CCTA with calcium score of zero, patient stable for discharge, instructed to f/u with PCP for stress.   Dagmar Hait, MD 02/08/13 [phone]

## 2013-02-14 ENCOUNTER — Ambulatory Visit (INDEPENDENT_AMBULATORY_CARE_PROVIDER_SITE_OTHER): Payer: PRIVATE HEALTH INSURANCE | Admitting: Family Medicine

## 2013-02-14 VITALS — BP 110/60 | HR 60 | Temp 97.1°F | Resp 18 | Ht 68.0 in | Wt 147.0 lb

## 2013-02-14 DIAGNOSIS — Z8249 Family history of ischemic heart disease and other diseases of the circulatory system: Secondary | ICD-10-CM

## 2013-02-14 DIAGNOSIS — F411 Generalized anxiety disorder: Secondary | ICD-10-CM

## 2013-02-14 DIAGNOSIS — F419 Anxiety disorder, unspecified: Secondary | ICD-10-CM

## 2013-02-14 DIAGNOSIS — R0789 Other chest pain: Secondary | ICD-10-CM

## 2013-02-14 MED ORDER — FLUOXETINE HCL 10 MG PO CAPS: 10.0000 mg | ORAL_CAPSULE | Freq: Every day | ORAL | Status: DC

## 2013-02-14 NOTE — Patient Instructions (Signed)
Start the prozac  Referral to cardiology  Take 1/2 tablet during the day if you need to  F/U 6 weeks

## 2013-02-14 NOTE — Progress Notes (Signed)
  Subjective:    Patient ID: Tracey Fox, female    DOB: [birth year], 57 y.o.   MRN: [id number]  HPI  Pt here to f/u ER visit. Was seen 1 week ago due to chest pain and SOB. She has been under a lot of stress and has been very anxious. Work up in ED negative. She has not taken xanax during the day but takes at night. She has stress with her 29 year old daughter who is on drugs. She is also caring for an 55 y.o. Lucila Maine and still grieving her brother's recent death.   Review of Systems   GEN- denies fatigue, fever, weight loss,weakness, recent illness HEENT- denies eye drainage, change in vision, nasal discharge, CVS- denies chest pain, palpitations RESP- denies SOB, cough, wheeze ABD- denies N/V, change in stools, abd pain GU- denies dysuria, hematuria, dribbling, incontinence MSK- denies joint pain, muscle aches, injury Neuro- denies headache, dizziness, syncope, seizure activity      Objective:   Physical Exam GEN- NAD, alert and oriented x3 HEENT- PERRL, EOMI, non injected sclera, pink conjunctiva, MMM, oropharynx clear Neck- Supple,  CVS- RRR, no murmur RESP-CTAB EXT- No edema Pulses- Radial 2+        Assessment & Plan:

## 2013-02-16 ENCOUNTER — Encounter: Payer: Self-pay | Admitting: Family Medicine

## 2013-02-16 DIAGNOSIS — Z8249 Family history of ischemic heart disease and other diseases of the circulatory system: Secondary | ICD-10-CM | POA: Insufficient documentation

## 2013-02-16 DIAGNOSIS — R0789 Other chest pain: Secondary | ICD-10-CM | POA: Insufficient documentation

## 2013-02-16 NOTE — Assessment & Plan Note (Signed)
Negative work up though she does have early MI in her father Will send for stress testing

## 2013-02-16 NOTE — Assessment & Plan Note (Signed)
Restart prozac 10mg , continue xanax, okay to use 1/2 tablet during the day

## 2013-02-28 ENCOUNTER — Ambulatory Visit: Payer: PRIVATE HEALTH INSURANCE | Admitting: Cardiology

## 2013-03-12 ENCOUNTER — Ambulatory Visit (INDEPENDENT_AMBULATORY_CARE_PROVIDER_SITE_OTHER): Payer: PRIVATE HEALTH INSURANCE | Admitting: Cardiovascular Disease

## 2013-03-12 ENCOUNTER — Encounter: Payer: Self-pay | Admitting: Cardiovascular Disease

## 2013-03-12 ENCOUNTER — Encounter: Payer: Self-pay | Admitting: *Deleted

## 2013-03-12 VITALS — BP 113/69 | HR 57 | Ht 67.0 in | Wt 147.0 lb

## 2013-03-12 DIAGNOSIS — F419 Anxiety disorder, unspecified: Secondary | ICD-10-CM

## 2013-03-12 DIAGNOSIS — R079 Chest pain, unspecified: Secondary | ICD-10-CM

## 2013-03-12 DIAGNOSIS — F411 Generalized anxiety disorder: Secondary | ICD-10-CM

## 2013-03-12 DIAGNOSIS — R0602 Shortness of breath: Secondary | ICD-10-CM

## 2013-03-12 DIAGNOSIS — K229 Disease of esophagus, unspecified: Secondary | ICD-10-CM

## 2013-03-12 NOTE — Patient Instructions (Signed)
Your physician recommends that you schedule a follow-up appointment in: 4-6 weeks. Your physician recommends that you continue on your current medications as directed. Please refer to the Current Medication list given to you today. Your physician has requested that you have a stress echocardiogram. For further information please visit [email or web address] Please follow instruction sheet as given.

## 2013-03-12 NOTE — Progress Notes (Signed)
Patient ID: Tracey Fox, female   DOB: 1955/07/02, 57 y.o.   MRN: [id number]       CARDIOLOGY CONSULT NOTE  Patient ID: Tracey Fox MRN: [id number] DOB/AGE: 02-Feb-1956 57 y.o.  Admit date: (Not on file) Primary Physician Milinda Antis, MD  Reason for Consultation: chest pain  HPI: Ms. Tracey Fox is a 57 yr old woman with a PMH significant for GERD and significant anxiety, who had been experiencing chest pain for approximately one week. It was substernally located and occurred at rest. She felt concomitant shortness of breath, weakness, and diaphoresis. The longest episode lasted 15 minutes. This occurred intermittently over a week. She presented to the ED and troponins were normal. A recent lipid panel reveals TC 152, LDL 96, HDL 43, TG 63.  She does not smoke and has a h/o reportedly normal PFT's. A chest xray in 01/2013 showed a stable 0.5 cm RUL nodule.  It appears she was evaluated by my colleague, Dr. Jens Som, in 08/2010 for a similar occurrence and had been evaluated prior to that in 2003.  She tells me she it was recommended to her that she may need an esophageal dilatation, but she deferred as she felt her symptoms improved.  SocHx: unmarried. 4 children. Lives in [place]. 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  FamHx: her mother died unexpectedly of urosepsis last year at age 82 and her 58 yr old brother, with whom she lived with, committed suicide this past year. She was very close to him. Her father died at 45 of an MI, and had been a smoker.    No Known Allergies  Current Outpatient Prescriptions  Medication Sig Dispense Refill  . albuterol (PROVENTIL HFA;VENTOLIN HFA) 108 (90 BASE) MCG/ACT inhaler Inhale 2 puffs into the lungs every 4 (four) hours as needed for wheezing.  1 Inhaler  0  . ALPRAZolam (XANAX) 1 MG tablet Take 1 mg by mouth 2 (two) times daily as needed for anxiety.      Marland Kitchen aspirin EC 81 MG tablet Take 81 mg by mouth daily.      Marland Kitchen FLUoxetine  (PROZAC) 10 MG capsule Take 1 capsule (10 mg total) by mouth daily.  30 capsule  3  . pantoprazole (PROTONIX) 40 MG tablet Take 40 mg by mouth daily.       No current facility-administered medications for this visit.    Past Medical History  Diagnosis Date  . Anxiety   . GERD (gastroesophageal reflux disease)     Past Surgical History  Procedure Laterality Date  . Abdominal hysterectomy    . Breast surgery    . Cholecystectomy      History   Social History  . Marital Status: Married    Spouse Name: N/A    Number of Children: 4  . Years of Education: N/A   Occupational History  . office manager    Social History Main Topics  . Smoking status: Never Smoker   . Smokeless tobacco: Never Used  . Alcohol Use: Yes     Comment: occ  . Drug Use: No  . Sexual Activity: Not on file   Other Topics Concern  . Not on file   Social History Narrative  . No narrative on file     Family History  Problem Relation Age of Onset  . Heart disease Father   . COPD Brother     oldest brother  . Breast cancer Sister      Prior to Admission medications  Medication Sig Start Date End Date Taking? Authorizing Provider  albuterol (PROVENTIL HFA;VENTOLIN HFA) 108 (90 BASE) MCG/ACT inhaler Inhale 2 puffs into the lungs every 4 (four) hours as needed for wheezing. 11/24/12  Yes Hayden Rasmussen, NP  ALPRAZolam Prudy Feeler) 1 MG tablet Take 1 mg by mouth 2 (two) times daily as needed for anxiety.   Yes Historical Provider, MD  aspirin EC 81 MG tablet Take 81 mg by mouth daily.   Yes Historical Provider, MD  FLUoxetine (PROZAC) 10 MG capsule Take 1 capsule (10 mg total) by mouth daily. 02/14/13  Yes Salley Scarlet, MD  pantoprazole (PROTONIX) 40 MG tablet Take 40 mg by mouth daily.   Yes Historical Provider, MD     Review of systems complete and found to be negative unless listed above in HPI     Physical exam Blood pressure 113/69, pulse 57, height 5\' 7"  (1.702 m), weight 147 lb (66.679  kg). General: NAD Neck: No JVD, no thyromegaly or thyroid nodule.  Lungs: Clear to auscultation bilaterally with normal respiratory effort. CV: Nondisplaced PMI.  Heart regular S1/S2, no S3/S4, no murmur.  No peripheral edema.  No carotid bruit.  Normal pedal pulses.  Abdomen: Soft, nontender, no hepatosplenomegaly, no distention.  Skin: Intact without lesions or rashes.  Neurologic: Alert and oriented x 3.  Psych: Normal affect. Extremities: No clubbing or cyanosis.  HEENT: Normal.   Labs:   Lab Results  Component Value Date   WBC 5.3 02/06/2013   HGB 14.8 02/06/2013   HCT 43.0 02/06/2013   MCV 89.0 02/06/2013   PLT 229 02/06/2013   No results found for this basename: NA, K, CL, CO2, BUN, CREATININE, CALCIUM, LABALBU, PROT, BILITOT, ALKPHOS, ALT, AST, GLUCOSE,  in the last 168 hours Lab Results  Component Value Date   CKTOTAL 49 08/23/2010   CKMB 0.7 08/23/2010   TROPONINI  Value: 0.02        NO INDICATION OF MYOCARDIAL INJURY. 08/23/2010    Lab Results  Component Value Date   CHOL 152 01/07/2013   Lab Results  Component Value Date   HDL 43 01/07/2013   Lab Results  Component Value Date   LDLCALC 96 01/07/2013   Lab Results  Component Value Date   TRIG 63 01/07/2013   Lab Results  Component Value Date   CHOLHDL 3.5 01/07/2013   No results found for this basename: LDLDIRECT       EKG: see HPI  Studies: See HPI   ASSESSMENT AND PLAN:  1. Chest pain: given her lack of risk factors, normal ECG, and a very atypical presentation, she appears to be at low risk for having ischemic heart disease. She is concerned about her family history with regards to her father passing at 39 of an MI. I will proceed with a stress echocardiogram to be prudent. If her stress test is normal, I would consider either an esophageal etiology which may have been provoked by the very significant stressors in her life, with the passing of her mother and brother within a 74 month span, as well as  children with substance issues. It appears she has had prior workups for chest pain, all deemed to be noncardiac in etiology.  Dispo: will f/u in 4-6 weeks.  Signed: Prentice Docker, M.D., F.A.C.C.  03/12/2013, 9:12 AM

## 2013-03-27 ENCOUNTER — Ambulatory Visit (HOSPITAL_COMMUNITY): Admission: RE | Admit: 2013-03-27 | Payer: PRIVATE HEALTH INSURANCE | Source: Ambulatory Visit

## 2013-03-28 ENCOUNTER — Ambulatory Visit: Payer: PRIVATE HEALTH INSURANCE | Admitting: Family Medicine

## 2013-04-18 ENCOUNTER — Encounter (HOSPITAL_COMMUNITY): Payer: Self-pay

## 2013-04-18 ENCOUNTER — Ambulatory Visit (HOSPITAL_COMMUNITY)
Admission: RE | Admit: 2013-04-18 | Discharge: 2013-04-18 | Disposition: A | Discharge status: Home / Self Care | Payer: 59 | Source: Ambulatory Visit | Attending: Cardiovascular Disease | Admitting: Cardiovascular Disease

## 2013-04-18 DIAGNOSIS — R0609 Other forms of dyspnea: Secondary | ICD-10-CM | POA: Insufficient documentation

## 2013-04-18 DIAGNOSIS — R079 Chest pain, unspecified: Secondary | ICD-10-CM | POA: Insufficient documentation

## 2013-04-18 DIAGNOSIS — K219 Gastro-esophageal reflux disease without esophagitis: Secondary | ICD-10-CM | POA: Insufficient documentation

## 2013-04-18 DIAGNOSIS — R072 Precordial pain: Secondary | ICD-10-CM

## 2013-04-18 DIAGNOSIS — R0989 Other specified symptoms and signs involving the circulatory and respiratory systems: Secondary | ICD-10-CM | POA: Insufficient documentation

## 2013-04-18 DIAGNOSIS — F411 Generalized anxiety disorder: Secondary | ICD-10-CM | POA: Insufficient documentation

## 2013-04-18 DIAGNOSIS — Z8249 Family history of ischemic heart disease and other diseases of the circulatory system: Secondary | ICD-10-CM | POA: Insufficient documentation

## 2013-04-18 NOTE — Progress Notes (Signed)
Stress Lab Nurses Notes - [facility]  EARTHA VONBEHREN 04/18/2013 Reason for doing test: Chest Pain Type of test: Stress Echo Nurse performing test: Parke Poisson, RN Nuclear Medicine Tech: Not Applicable Echo Tech: Veda Canning MD performing test: Dr. Sondra Barges.Lawrence NP Family MD: Dr. Jeanice Lim Test explained and consent signed: yes IV started: No IV started Symptoms: Fatigue Treatment/Intervention: None Reason test stopped: reached target HR After recovery IV was: NA Patient to return to Nuc. Med at : NA Patient discharged: Home Patient's Condition upon discharge was: stable Comments: During test peak BP 142/76 & HR 148.  Recovery BP 99/60 & HR 70.  Symptoms resolved in recovery. Erskine Speed T

## 2013-04-18 NOTE — Progress Notes (Signed)
*  PRELIMINARY RESULTS* Echocardiogram Echocardiogram Stress Test has been performed.  Tracey Fox 04/18/2013, 11:07 AM

## 2013-04-23 ENCOUNTER — Ambulatory Visit: Payer: PRIVATE HEALTH INSURANCE | Admitting: Cardiovascular Disease

## 2013-04-28 ENCOUNTER — Telehealth: Payer: Self-pay | Admitting: *Deleted

## 2013-04-28 NOTE — Telephone Encounter (Signed)
Okay to refill? 

## 2013-04-28 NOTE — Telephone Encounter (Signed)
Last office visit 02/14/2013

## 2013-04-30 MED ORDER — ALPRAZOLAM 1 MG PO TABS: 1.0000 mg | ORAL_TABLET | Freq: Two times a day (BID) | ORAL | Status: DC | PRN

## 2013-04-30 NOTE — Telephone Encounter (Signed)
Med refilled.

## 2013-05-23 ENCOUNTER — Encounter: Payer: Self-pay | Admitting: Cardiovascular Disease

## 2013-05-23 ENCOUNTER — Ambulatory Visit (INDEPENDENT_AMBULATORY_CARE_PROVIDER_SITE_OTHER): Payer: PRIVATE HEALTH INSURANCE | Admitting: Cardiovascular Disease

## 2013-05-23 VITALS — BP 118/70 | HR 57 | Ht 68.0 in | Wt 144.0 lb

## 2013-05-23 DIAGNOSIS — Z136 Encounter for screening for cardiovascular disorders: Secondary | ICD-10-CM

## 2013-05-23 DIAGNOSIS — IMO0001 Reserved for inherently not codable concepts without codable children: Secondary | ICD-10-CM

## 2013-05-23 DIAGNOSIS — F419 Anxiety disorder, unspecified: Secondary | ICD-10-CM

## 2013-05-23 DIAGNOSIS — F411 Generalized anxiety disorder: Secondary | ICD-10-CM

## 2013-05-23 DIAGNOSIS — R079 Chest pain, unspecified: Secondary | ICD-10-CM

## 2013-05-23 DIAGNOSIS — K229 Disease of esophagus, unspecified: Secondary | ICD-10-CM

## 2013-05-23 NOTE — Patient Instructions (Addendum)
Your physician recommends that you schedule a follow-up appointment in: as needed Your physician recommends that you continue on your current medications as directed. Please refer to the Current Medication list given to you today.   

## 2013-05-23 NOTE — Progress Notes (Signed)
Patient ID: Tracey Fox, female   DOB: 1955-07-22, 58 y.o.   MRN: [id number]      SUBJECTIVE: The patient is here to followup on the results of cardiovascular testing performed for the evaluation of chest pain. She underwent a normal stress echocardiogram which was negative for inducible ischemia. She has had no further episodes of chest pain and denies shortness of breath, palpitations, lightheadedness, dizziness, orthopnea, syncope and leg swelling. She thinks her symptoms may be treatable to acid reflux. She was concerned because her father passed away suddenly of a myocardial infarction in his 71s.   No Known Allergies  Current Outpatient Prescriptions  Medication Sig Dispense Refill  . albuterol (PROVENTIL HFA;VENTOLIN HFA) 108 (90 BASE) MCG/ACT inhaler Inhale 2 puffs into the lungs every 4 (four) hours as needed for wheezing.  1 Inhaler  0  . ALPRAZolam (XANAX) 1 MG tablet Take 1 tablet (1 mg total) by mouth 2 (two) times daily as needed for anxiety.  30 tablet  0  . aspirin EC 81 MG tablet Take 81 mg by mouth daily.      Marland Kitchen FLUoxetine (PROZAC) 10 MG capsule Take 1 capsule (10 mg total) by mouth daily.  30 capsule  3  . pantoprazole (PROTONIX) 40 MG tablet Take 40 mg by mouth daily.       No current facility-administered medications for this visit.    Past Medical History  Diagnosis Date  . Anxiety   . GERD (gastroesophageal reflux disease)     Past Surgical History  Procedure Laterality Date  . Abdominal hysterectomy    . Breast surgery    . Cholecystectomy      History   Social History  . Marital Status: Divorced    Spouse Name: N/A    Number of Children: 2  . Years of Education: N/A   Occupational History  . office manager    Social History Main Topics  . Smoking status: Never Smoker   . Smokeless tobacco: Never Used  . Alcohol Use: Yes     Comment: occ  . Drug Use: No  . Sexual Activity: Not on file   Other Topics Concern  . Not on file   Social  History Narrative  . No narrative on file     Filed Vitals:   05/23/13 1533  BP: 118/70  Pulse: 57  Height: 5\' 8"  (1.727 m)  Weight: 144 lb (65.318 kg)    PHYSICAL EXAM General: NAD Neck: No JVD, no thyromegaly or thyroid nodule.  Lungs: Clear to auscultation bilaterally with normal respiratory effort. CV: Nondisplaced PMI.  Heart regular S1/S2, no S3/S4, no murmur.  No peripheral edema.  No carotid bruit.  Normal pedal pulses.  Abdomen: Soft, nontender, no hepatosplenomegaly, no distention.  Neurologic: Alert and oriented x 3.  Psych: Normal affect. Extremities: No clubbing or cyanosis.   ECG: reviewed and available in electronic records.      ASSESSMENT AND PLAN: 1. Chest pain: given her lack of risk factors, normal ECG, and a very atypical presentation, she appears to be at low risk for having ischemic heart disease. She was concerned about her family history with regards to her father passing at 2 of an MI. Given that her stress echocardiogram was normal, no further cardiovascular testing is indicated at this time. I would consider an esophageal etiology which may have been provoked by the very significant stressors in her life, with the passing of her mother and brother within a 32 month span,  as well as children with substance issues.  It appears she has had prior workups for chest pain, all deemed to be noncardiac in etiology.   Dispo: will prn.    Kate Sable, M.D., F.A.C.C.

## 2013-05-30 ENCOUNTER — Other Ambulatory Visit: Payer: Self-pay | Admitting: Family Medicine

## 2013-05-31 NOTE — Telephone Encounter (Signed)
?   Ok to refill; last refill 04/30/13;last ov 02/14/13

## 2013-06-01 NOTE — Telephone Encounter (Signed)
Okay to refill? 

## 2013-06-02 ENCOUNTER — Telehealth: Payer: Self-pay | Admitting: Family Medicine

## 2013-06-02 NOTE — Telephone Encounter (Signed)
Pt is needing her Xanax refilled Call back number is [phone]

## 2013-06-02 NOTE — Telephone Encounter (Signed)
Meds refilled.

## 2013-06-02 NOTE — Telephone Encounter (Signed)
Ok to refill 

## 2013-06-03 NOTE — Telephone Encounter (Signed)
Okay to refill? 

## 2013-06-05 NOTE — Telephone Encounter (Signed)
Script was refille on 05/30/2013

## 2013-06-29 ENCOUNTER — Other Ambulatory Visit: Payer: Self-pay | Admitting: Family Medicine

## 2013-06-30 NOTE — Telephone Encounter (Signed)
Okay to refill, give 2 on each

## 2013-06-30 NOTE — Telephone Encounter (Signed)
Last OV 02/14/13.  Last RF xanax #60 05/30/13  Has OV 2/20  OK refill?

## 2013-06-30 NOTE — Telephone Encounter (Signed)
RX's called in  

## 2013-07-04 ENCOUNTER — Encounter: Payer: Self-pay | Admitting: Family Medicine

## 2013-07-04 ENCOUNTER — Ambulatory Visit (INDEPENDENT_AMBULATORY_CARE_PROVIDER_SITE_OTHER): Payer: PRIVATE HEALTH INSURANCE | Admitting: Family Medicine

## 2013-07-04 VITALS — BP 100/80 | HR 60 | Temp 97.7°F | Resp 18 | Ht 65.5 in | Wt 150.0 lb

## 2013-07-04 DIAGNOSIS — F411 Generalized anxiety disorder: Secondary | ICD-10-CM

## 2013-07-04 DIAGNOSIS — F419 Anxiety disorder, unspecified: Secondary | ICD-10-CM

## 2013-07-04 DIAGNOSIS — F329 Major depressive disorder, single episode, unspecified: Secondary | ICD-10-CM

## 2013-07-04 DIAGNOSIS — F32A Depression, unspecified: Secondary | ICD-10-CM

## 2013-07-04 DIAGNOSIS — F3289 Other specified depressive episodes: Secondary | ICD-10-CM

## 2013-07-04 MED ORDER — FLUOXETINE HCL 20 MG PO CAPS: 20.0000 mg | ORAL_CAPSULE | Freq: Every day | ORAL | Status: DC

## 2013-07-04 MED ORDER — ALPRAZOLAM 1 MG PO TABS: 1.0000 mg | ORAL_TABLET | Freq: Three times a day (TID) | ORAL | Status: DC | PRN

## 2013-07-04 NOTE — Patient Instructions (Signed)
Increase prozac to 20mg  ( take 2 of the 10mg  tablets ) Okay to use xanax during the day F/U 6 weeks

## 2013-07-06 DIAGNOSIS — F329 Major depressive disorder, single episode, unspecified: Secondary | ICD-10-CM | POA: Insufficient documentation

## 2013-07-06 NOTE — Assessment & Plan Note (Signed)
Increase prozac to 20mg  Xanax can be taken during the day, RX 90 tablets given

## 2013-07-06 NOTE — Assessment & Plan Note (Signed)
She suffers from depression as well as anxiety, she now has lost multiple family members and the grief continues to make daily activites hard for her She is still working and has support from her eldest brother She continues to think about her mother who passed away and the difficulties with her daughter Increase prozac to 20mg , reassess in 6 weeks No SI

## 2013-07-06 NOTE — Progress Notes (Signed)
Patient ID: Tracey Fox, female   DOB: 07-May-1956, 58 y.o.   MRN: [id number]   Subjective:    Patient ID: Tracey Fox, female    DOB: 04/24/1956, 58 y.o.   MRN: [id number]  Patient presents for 6 mos follow up  She has history of GAD and recently depressed mood due to grief from the passing of her brother. She states she still has bad days and she wants to cry but has some family members to rely on. She thinks the prozac 10mg  helps some but could use an increase. She finds herself very anxious during the day and would like to take the xanax during the day if needed, currently takes 1-2mg  at bedtime for sleep and anxiety.  No other concerns, she was seen by cardiology and cleared for the chest pain with normal stress ECHO    Review Of Systems:  GEN- denies fatigue, fever, weight loss,weakness, recent illness HEENT- denies eye drainage, change in vision, nasal discharge, CVS- denies chest pain, palpitations RESP- denies SOB, cough, wheeze ABD- denies N/V, change in stools, abd pain Neuro- denies headache, dizziness, syncope, seizure activity       Objective:    BP 100/80  Pulse 60  Temp(Src) 97.7 F (36.5 C) (Oral)  Resp 18  Ht 5' 5.5" (1.664 m)  Wt 150 lb (68.04 kg)  BMI 24.57 kg/m2 GEN- NAD, alert and oriented x3 Psych- normal affect and mood, tearful discussing brother, good eye contact, no SI         Assessment & Plan:      Problem List Items Addressed This Visit   None      Note: This dictation was prepared with Dragon dictation along with smaller phrase technology. Any transcriptional errors that result from this process are unintentional.

## 2013-09-24 ENCOUNTER — Other Ambulatory Visit: Payer: Self-pay | Admitting: Family Medicine

## 2013-09-24 NOTE — Telephone Encounter (Signed)
Ok to refill??  Last office visit/ refill 07/04/2013, 2 refills given.

## 2013-09-24 NOTE — Telephone Encounter (Signed)
Okay to refill , give 2 

## 2013-09-24 NOTE — Telephone Encounter (Signed)
Medication called to pharmacy. 

## 2013-10-05 ENCOUNTER — Other Ambulatory Visit: Payer: Self-pay | Admitting: Family Medicine

## 2013-10-07 NOTE — Telephone Encounter (Signed)
Refill appropriate and filled per protocol. 

## 2013-10-20 ENCOUNTER — Other Ambulatory Visit: Payer: Self-pay | Admitting: Family Medicine

## 2013-10-20 DIAGNOSIS — N6310 Unspecified lump in the right breast, unspecified quadrant: Secondary | ICD-10-CM

## 2013-10-22 ENCOUNTER — Ambulatory Visit (HOSPITAL_COMMUNITY)
Admission: RE | Admit: 2013-10-22 | Discharge: 2013-10-22 | Disposition: A | Discharge status: Home / Self Care | Payer: 59 | Source: Ambulatory Visit | Attending: Family Medicine | Admitting: Family Medicine

## 2013-10-22 ENCOUNTER — Other Ambulatory Visit: Payer: Self-pay | Admitting: Family Medicine

## 2013-10-22 DIAGNOSIS — N6310 Unspecified lump in the right breast, unspecified quadrant: Secondary | ICD-10-CM

## 2013-10-22 DIAGNOSIS — N6019 Diffuse cystic mastopathy of unspecified breast: Secondary | ICD-10-CM | POA: Insufficient documentation

## 2013-11-03 ENCOUNTER — Other Ambulatory Visit: Payer: Self-pay | Admitting: Family Medicine

## 2013-11-04 NOTE — Telephone Encounter (Signed)
Medication filled x1 with no refills.  

## 2013-12-10 ENCOUNTER — Encounter: Payer: Self-pay | Admitting: Family Medicine

## 2013-12-10 ENCOUNTER — Ambulatory Visit (INDEPENDENT_AMBULATORY_CARE_PROVIDER_SITE_OTHER): Payer: PRIVATE HEALTH INSURANCE | Admitting: Family Medicine

## 2013-12-10 VITALS — BP 120/68 | HR 64 | Temp 98.2°F | Resp 14 | Ht 66.5 in | Wt 149.0 lb

## 2013-12-10 DIAGNOSIS — F32A Depression, unspecified: Secondary | ICD-10-CM

## 2013-12-10 DIAGNOSIS — L089 Local infection of the skin and subcutaneous tissue, unspecified: Secondary | ICD-10-CM

## 2013-12-10 DIAGNOSIS — F411 Generalized anxiety disorder: Secondary | ICD-10-CM

## 2013-12-10 DIAGNOSIS — L723 Sebaceous cyst: Secondary | ICD-10-CM

## 2013-12-10 DIAGNOSIS — F3289 Other specified depressive episodes: Secondary | ICD-10-CM

## 2013-12-10 DIAGNOSIS — M545 Low back pain, unspecified: Secondary | ICD-10-CM

## 2013-12-10 DIAGNOSIS — M549 Dorsalgia, unspecified: Secondary | ICD-10-CM | POA: Insufficient documentation

## 2013-12-10 DIAGNOSIS — L729 Follicular cyst of the skin and subcutaneous tissue, unspecified: Principal | ICD-10-CM

## 2013-12-10 DIAGNOSIS — F419 Anxiety disorder, unspecified: Secondary | ICD-10-CM

## 2013-12-10 DIAGNOSIS — F329 Major depressive disorder, single episode, unspecified: Secondary | ICD-10-CM

## 2013-12-10 MED ORDER — SULFAMETHOXAZOLE-TMP DS 800-160 MG PO TABS: 1.0000 | ORAL_TABLET | Freq: Two times a day (BID) | ORAL | Status: DC

## 2013-12-10 MED ORDER — FLUOXETINE HCL 10 MG PO CAPS: ORAL_CAPSULE | ORAL | Status: DC

## 2013-12-10 MED ORDER — ALPRAZOLAM 1 MG PO TABS: ORAL_TABLET | ORAL | Status: DC

## 2013-12-10 NOTE — Assessment & Plan Note (Signed)
Doing well on current medications no changes.

## 2013-12-10 NOTE — Patient Instructions (Signed)
Take antibiotics as prescribed Continue current medications F/U 6 months for physical

## 2013-12-10 NOTE — Assessment & Plan Note (Signed)
Status post incision and drainage with a good amount of pus expressed. I will place her on Bactrim for 7 days. If this reoccurs of the [place] would be better she gets this removed by general surgery

## 2013-12-10 NOTE — Assessment & Plan Note (Signed)
Patient with a musculoskeletal pain. She has good range of motion. Think it is due to some lifting she's been doing at work. Will have her try Aleve twice a day she does not like to take a lot of oral medications if her eye she can try the topicals over-the-counter such as Aspercreme. If it does not improve or the pain worsens then we will get imaging

## 2013-12-10 NOTE — Progress Notes (Signed)
Patient ID: Tracey Fox, female   DOB: 1955-12-20, 58 y.o.   MRN: [id number]   Subjective:    Patient ID: Tracey Fox, female    DOB: November 27, 1955, 58 y.o.   MRN: [id number]  Patient presents for Painful Sore and Medication Review/ Refill  patient here to followup medications. She's been doing well on her anxiety medication and requests refills. She still sleeping fairly well with the Xanax at bedtime.  Low back pain she's had some low back pain and occasional left hip pain on and off she works at [employer] in that time she has a lift heavy doors this is when she notices the pain more. She does take ibuprofen occasionally which helps. No change in bowel bladder and appears seizures in her lower sure many for pain is nonradiating.  She felt a sore in the middle of her back a couple weeks ago and feels squishy at times and other times it feels hard and tender she did have some drainage out of it the one point    Review Of Systems:  GEN- denies fatigue, fever, weight loss,weakness, recent illness HEENT- denies eye drainage, change in vision, nasal discharge, CVS- denies chest pain, palpitations RESP- denies SOB, cough, wheeze ABD- denies N/V, change in stools, abd pain GU- denies dysuria, hematuria, dribbling, incontinence MSK- + joint pain, muscle aches, injury Neuro- denies headache, dizziness, syncope, seizure activity       Objective:    BP 120/68  Pulse 64  Temp(Src) 98.2 F (36.8 C) (Oral)  Resp 14  Ht 5' 6.5" (1.689 m)  Wt 149 lb (67.586 kg)  BMI 23.69 kg/m2 GEN- NAD, alert and oriented x3 CVS- RRR, no murmur RESP-CTAB Skin- 3cm epidermal cyst, +fluctance, pit at center MSK-Spine NT, good ROM, Hip good ROM, mild discomfort with IR Psych- normal affect and mood Pulses- Radial 2+  Procedure- Incision and Drainage Procedure explained to patient questions answered benefits and risks discussed verbal consent obtained. Antiseptic-Betadine Anesthesia-lidocaine  1% with Epi Incision performed large amount of pus expressed Culture taken Minimal blood loss Patient tolerated procedure well Bandage applied       Assessment & Plan:      Problem List Items Addressed This Visit   Infected cyst of skin   Depression - Primary   Relevant Medications      FLUoxetine (PROZAC) capsule      ALPRAZolam (XANAX) tablet   Anxiety   Relevant Medications      FLUoxetine (PROZAC) capsule      ALPRAZolam Duanne Moron) tablet      Note: This dictation was prepared with Dragon dictation along with smaller phrase technology. Any transcriptional errors that result from this process are unintentional.

## 2013-12-12 LAB — WOUND CULTURE
Gram Stain: NONE SEEN
Gram Stain: NONE SEEN
Organism ID, Bacteria: NO GROWTH

## 2013-12-21 ENCOUNTER — Other Ambulatory Visit: Payer: Self-pay | Admitting: Family Medicine

## 2013-12-22 NOTE — Telephone Encounter (Signed)
MD approved.   Medication called to pharmacy.

## 2014-04-04 ENCOUNTER — Other Ambulatory Visit: Payer: Self-pay | Admitting: Family Medicine

## 2014-04-06 NOTE — Telephone Encounter (Signed)
Okay to refill give 2 

## 2014-04-06 NOTE — Telephone Encounter (Signed)
Ok to refill??  Last office visit 7/529/2015.  Last refill 12/22/2013.

## 2014-04-06 NOTE — Telephone Encounter (Signed)
Medication called to pharmacy. 

## 2014-05-05 ENCOUNTER — Other Ambulatory Visit: Payer: Self-pay | Admitting: Family Medicine

## 2014-05-05 NOTE — Telephone Encounter (Signed)
Called pharmacy, they did have refills from last month on file.  This refill denied

## 2014-05-12 ENCOUNTER — Other Ambulatory Visit: Payer: Self-pay | Admitting: Family Medicine

## 2014-05-12 NOTE — Telephone Encounter (Signed)
Medication refilled per protocol. 

## 2014-06-12 ENCOUNTER — Ambulatory Visit: Payer: PRIVATE HEALTH INSURANCE | Admitting: Family Medicine

## 2014-06-17 ENCOUNTER — Ambulatory Visit (INDEPENDENT_AMBULATORY_CARE_PROVIDER_SITE_OTHER): Payer: PRIVATE HEALTH INSURANCE | Admitting: Family Medicine

## 2014-06-17 ENCOUNTER — Encounter: Payer: Self-pay | Admitting: Family Medicine

## 2014-06-17 VITALS — BP 126/72 | HR 64 | Temp 97.7°F | Resp 16 | Ht 67.0 in | Wt 153.0 lb

## 2014-06-17 DIAGNOSIS — R300 Dysuria: Secondary | ICD-10-CM

## 2014-06-17 DIAGNOSIS — N3001 Acute cystitis with hematuria: Secondary | ICD-10-CM | POA: Diagnosis not present

## 2014-06-17 DIAGNOSIS — F419 Anxiety disorder, unspecified: Secondary | ICD-10-CM

## 2014-06-17 DIAGNOSIS — F329 Major depressive disorder, single episode, unspecified: Secondary | ICD-10-CM

## 2014-06-17 DIAGNOSIS — F32A Depression, unspecified: Secondary | ICD-10-CM

## 2014-06-17 LAB — URINALYSIS, ROUTINE W REFLEX MICROSCOPIC
GLUCOSE, UA: NEGATIVE mg/dL
NITRITE: NEGATIVE
PH: 6 (ref 5.0–8.0)
PROTEIN: NEGATIVE mg/dL
SPECIFIC GRAVITY, URINE: 1.015 (ref 1.005–1.030)
Urobilinogen, UA: 0.2 mg/dL (ref 0.0–1.0)

## 2014-06-17 LAB — URINALYSIS, MICROSCOPIC ONLY
CASTS: NONE SEEN
Crystals: NONE SEEN

## 2014-06-17 MED ORDER — ALPRAZOLAM 1 MG PO TABS: 1.0000 mg | ORAL_TABLET | Freq: Two times a day (BID) | ORAL | Status: DC | PRN

## 2014-06-17 MED ORDER — THERA VITAL M PO TABS: 1.0000 | ORAL_TABLET | Freq: Every day | ORAL | Status: DC

## 2014-06-17 MED ORDER — CIPROFLOXACIN HCL 500 MG PO TABS: 500.0000 mg | ORAL_TABLET | Freq: Two times a day (BID) | ORAL | Status: DC

## 2014-06-17 MED ORDER — FLUOXETINE HCL 10 MG PO CAPS: 10.0000 mg | ORAL_CAPSULE | Freq: Every day | ORAL | Status: DC

## 2014-06-17 NOTE — Assessment & Plan Note (Signed)
Doing well on current meds , prozac and xanax will continue Sleep also improved

## 2014-06-17 NOTE — Patient Instructions (Addendum)
Continue current meds Take antibiotics More water, add cranberry juice F/U 6 months for PHYSICAL

## 2014-06-17 NOTE — Progress Notes (Signed)
Patient ID: Tracey Fox, female   DOB: [birth year], 59 y.o.   MRN: [id number]   Subjective:    Patient ID: Tracey Fox, female    DOB: 1955/12/08, 59 y.o.   MRN: [id number]  Patient presents for 6 month F/U and Dysuria  patient here for follow-up chronic medical problems. I reviewed her fasting class which she will her cholesterol renal function kidney function were all limits. She is taking her Prozac and Xanax as described difficulties with these medications and she still sleeping well at bedtime.  She has had urinary frequency and pelvic pressure for the past she also has a mild odor to urine is darker unusual. No fever or associated  Declines TDAP to day   Review Of Systems:  GEN- denies fatigue, fever, weight loss,weakness, recent illness HEENT- denies eye drainage, change in vision, nasal discharge, CVS- denies chest pain, palpitations RESP- denies SOB, cough, wheeze ABD- denies N/V, change in stools, abd pain GU- +dysuria, hematuria, dribbling, incontinence MSK- denies joint pain, muscle aches, injury Neuro- denies headache, dizziness, syncope, seizure activity       Objective:    BP 126/72 mmHg  Pulse 64  Temp(Src) 97.7 F (36.5 C) (Oral)  Resp 16  Ht 5\' 7"  (1.702 m)  Wt 153 lb (69.4 kg)  BMI 23.96 kg/m2 GEN- NAD, alert and oriented x3 CVS- RRR, no murmur RESP-CTAB ABD-NABS,soft,ND, + suprapubic pressure, no CVA tenderness Psych- normal affect and mood        Assessment & Plan:      Problem List Items Addressed This Visit      Unprioritized   Depression   Anxiety    Other Visit Diagnoses    Dysuria    -  Primary    Relevant Orders    Urinalysis, Routine w reflex microscopic (Completed)    Acute cystitis with hematuria        Cipro x 5 days, cranberry, increase water       Note: This dictation was prepared with Dragon dictation along with smaller phrase technology. Any transcriptional errors that result from this process are unintentional.

## 2014-06-29 ENCOUNTER — Other Ambulatory Visit: Payer: Self-pay | Admitting: Family Medicine

## 2014-06-30 NOTE — Telephone Encounter (Signed)
Call placed to pharmacy. Was advised that prescription request is old and patient does have refills on file.   Refill denied.

## 2014-10-14 ENCOUNTER — Other Ambulatory Visit: Payer: Self-pay | Admitting: Family Medicine

## 2014-10-14 NOTE — Telephone Encounter (Signed)
Okay to refill? 

## 2014-10-14 NOTE — Telephone Encounter (Signed)
Ok to refill??  Last office visit/ refill 06/17/2014, #3 refills.

## 2014-11-12 ENCOUNTER — Other Ambulatory Visit: Payer: Self-pay | Admitting: Family Medicine

## 2014-11-12 NOTE — Telephone Encounter (Signed)
LRF 10/14/14 #60  LOV 06/17/14  OK refill?

## 2014-11-13 ENCOUNTER — Other Ambulatory Visit: Payer: Self-pay | Admitting: Family Medicine

## 2014-11-13 NOTE — Telephone Encounter (Signed)
Requesting refill on Xanax.  Is already in provider box from escribe.

## 2014-11-13 NOTE — Telephone Encounter (Signed)
Okay to refill give 3 

## 2014-11-13 NOTE — Telephone Encounter (Signed)
Medication refilled per protocol. 

## 2015-02-01 ENCOUNTER — Encounter: Payer: Self-pay | Admitting: *Deleted

## 2015-02-01 ENCOUNTER — Other Ambulatory Visit: Payer: Self-pay | Admitting: Family Medicine

## 2015-02-01 NOTE — Telephone Encounter (Signed)
Ok to refill??  Last office visit 06/17/2014.  Last refill 11/13/2014, #2 refills.

## 2015-02-01 NOTE — Telephone Encounter (Signed)
ok 

## 2015-02-01 NOTE — Telephone Encounter (Signed)
Medication called to pharmacy. 

## 2015-03-10 ENCOUNTER — Encounter: Payer: Self-pay | Admitting: Family Medicine

## 2015-03-10 ENCOUNTER — Ambulatory Visit (INDEPENDENT_AMBULATORY_CARE_PROVIDER_SITE_OTHER): Payer: PRIVATE HEALTH INSURANCE | Admitting: Family Medicine

## 2015-03-10 VITALS — BP 110/62 | HR 70 | Temp 98.0°F | Resp 14 | Ht 67.0 in | Wt 153.0 lb

## 2015-03-10 DIAGNOSIS — F329 Major depressive disorder, single episode, unspecified: Secondary | ICD-10-CM | POA: Diagnosis not present

## 2015-03-10 DIAGNOSIS — F32A Depression, unspecified: Secondary | ICD-10-CM

## 2015-03-10 DIAGNOSIS — F419 Anxiety disorder, unspecified: Secondary | ICD-10-CM | POA: Diagnosis not present

## 2015-03-10 MED ORDER — ALPRAZOLAM 1 MG PO TABS: 1.0000 mg | ORAL_TABLET | Freq: Two times a day (BID) | ORAL | Status: DC | PRN

## 2015-03-10 MED ORDER — FLUOXETINE HCL 10 MG PO CAPS: 10.0000 mg | ORAL_CAPSULE | Freq: Every day | ORAL | Status: DC

## 2015-03-10 NOTE — Progress Notes (Signed)
Patient ID: Tracey Fox, female   DOB: 05-27-1955, 59 y.o.   MRN: [id number]   Subjective:    Patient ID: Tracey Fox, female    DOB: 02-02-1956, 59 y.o.   MRN: [id number]  Patient presents for Medication Review/ Refills   patient here to follow-up medications. She has no new concerns today. She did move into her mother's home and she is in a new relationship which she is very excited about. She is still working full-time. She still gets stressed thinking about her brother's suicide  2 years ago but the Prozac and Xanax help.  She had a wellness screening done today and have fasting labs drawn she will get me copies of these. Declines flu shot      Review Of Systems:  GEN- denies fatigue, fever, weight loss,weakness, recent illness HEENT- denies eye drainage, change in vision, nasal discharge, CVS- denies chest pain, palpitations RESP- denies SOB, cough, wheeze ABD- denies N/V, change in stools, abd pain GU- denies dysuria, hematuria, dribbling, incontinence MSK- denies joint pain, muscle aches, injury Neuro- denies headache, dizziness, syncope, seizure activity       Objective:    BP 110/62 mmHg  Pulse 70  Temp(Src) 98 F (36.7 C) (Oral)  Resp 14  Ht '5\' 7"'$  (1.702 m)  Wt 153 lb (69.4 kg)  BMI 23.96 kg/m2 GEN- NAD, alert and oriented x3 CVS- RRR, no murmur RESP-occasional expiratory wheeze, normal WOB, no rales, no rhonchi Psych- normal affect and mood , well groomed  EXT- No edema Pulses- Radial- 2+        Assessment & Plan:      Problem List Items Addressed This Visit    Major depression ([place])    Controlled on prozac and xanax, she is working, in new relationship which is great for her. I will obtain her recent set of labs Medications refilled approx 15 minutes spent with pt > 50% spent on counseling and medication management      Relevant Medications   FLUoxetine (PROZAC) 10 MG capsule   ALPRAZolam (XANAX) 1 MG tablet   Anxiety - Primary   Relevant  Medications   FLUoxetine (PROZAC) 10 MG capsule   ALPRAZolam (XANAX) 1 MG tablet      Note: This dictation was prepared with Dragon dictation along with smaller phrase technology. Any transcriptional errors that result from this process are unintentional.

## 2015-03-10 NOTE — Assessment & Plan Note (Signed)
Controlled on prozac and xanax, she is working, in new relationship which is great for her. I will obtain her recent set of labs Medications refilled approx 15 minutes spent with pt > 50% spent on counseling and medication management

## 2015-03-10 NOTE — Patient Instructions (Signed)
Schedule Mammogram  Fax me the wellness results- [phone] Medications refilled F/U 6 months - PHYSICAL

## 2015-04-04 ENCOUNTER — Other Ambulatory Visit: Payer: Self-pay | Admitting: Family Medicine

## 2015-04-05 NOTE — Telephone Encounter (Signed)
Refill appropriate and filled per protocol. 

## 2015-04-16 ENCOUNTER — Ambulatory Visit (INDEPENDENT_AMBULATORY_CARE_PROVIDER_SITE_OTHER): Payer: PRIVATE HEALTH INSURANCE | Admitting: Family Medicine

## 2015-04-16 ENCOUNTER — Encounter: Payer: Self-pay | Admitting: Family Medicine

## 2015-04-16 VITALS — BP 118/64 | HR 72 | Temp 97.6°F | Resp 16 | Ht 67.0 in | Wt 153.0 lb

## 2015-04-16 DIAGNOSIS — R7989 Other specified abnormal findings of blood chemistry: Secondary | ICD-10-CM | POA: Diagnosis not present

## 2015-04-16 DIAGNOSIS — F419 Anxiety disorder, unspecified: Secondary | ICD-10-CM

## 2015-04-16 LAB — COMPREHENSIVE METABOLIC PANEL
ALT: 14 U/L (ref 6–29)
AST: 19 U/L (ref 10–35)
Albumin: 4.4 g/dL (ref 3.6–5.1)
Alkaline Phosphatase: 97 U/L (ref 33–130)
BUN: 14 mg/dL (ref 7–25)
CO2: 25 mmol/L (ref 20–31)
CREATININE: 0.63 mg/dL (ref 0.50–1.05)
Calcium: 9.7 mg/dL (ref 8.6–10.4)
Chloride: 104 mmol/L (ref 98–110)
GLUCOSE: 88 mg/dL (ref 70–99)
POTASSIUM: 4 mmol/L (ref 3.5–5.3)
Sodium: 137 mmol/L (ref 135–146)
TOTAL PROTEIN: 7 g/dL (ref 6.1–8.1)
Total Bilirubin: 1 mg/dL (ref 0.2–1.2)

## 2015-04-16 LAB — T4, FREE: FREE T4: 1.04 ng/dL (ref 0.80–1.80)

## 2015-04-16 LAB — PHOSPHORUS: Phosphorus: 2.4 mg/dL — ABNORMAL LOW (ref 2.5–4.5)

## 2015-04-16 LAB — T3, FREE: T3 FREE: 3.2 pg/mL (ref 2.3–4.2)

## 2015-04-16 LAB — TSH: TSH: 0.491 u[IU]/mL (ref 0.350–4.500)

## 2015-04-16 NOTE — Assessment & Plan Note (Signed)
I think her underlying anxieties contributing to most of her symptoms. While her TSH is mildly abnormal I would recheck this along with T3 and T4 to see if this is a true hyperthyroidism or subclinical. Her phosphate was also minimally low but this seems to be nonspecific or calcium and protein levels renal function were all normal. I would have her increase her Prozac to 20 mg once a day and continue her Xanax. She can try taking a half a tablet during the daytime if needed. We will follow-up after her results.

## 2015-04-16 NOTE — Patient Instructions (Signed)
Take 2 of the prozac in the morning We will call with lab results F/U as previous

## 2015-04-16 NOTE — Progress Notes (Signed)
Patient ID: Tracey Fox, female   DOB: 01-24-1956, 59 y.o.   MRN: [id number]   Subjective:    Patient ID: Tracey Fox, female    DOB: [birth year], 59 y.o.   MRN: [id number]  Patient presents for F/U Blood Work and Fatigue  Here to follow-up labs from her wellness exam. Her labs are normal with the exception of her TSH this was low at 0.382 she has been under more stress recently. She actually received a better evaluation at work which she has been there for 11 years and has never had any problems. She states there is difficulty to wean her and another employee. She's been taking her Xanax at bedtime but feels like she needs it during the day but is afraid to take it because it makes her sleepy. She has been taking Prozac 10 mg as previously prescribed. She does state that occasionally she might feel a palpitation when she is really stressed out but that she is also not sleeping very well especially after getting her bad evaluation. She has had fatigue which has been present for greater than 3 months.   Review Of Systems:  GEN- +fatigue, denies fever, weight loss,weakness, recent illness HEENT- denies eye drainage, change in vision, nasal discharge, CVS- denies chest pain, palpitations RESP- denies SOB, cough, wheeze ABD- denies N/V, change in stools, abd pain GU- denies dysuria, hematuria, dribbling, incontinence MSK- denies joint pain, muscle aches, injury Neuro- denies headache, dizziness, syncope, seizure activity       Objective:    BP 118/64 mmHg  Pulse 72  Temp(Src) 97.6 F (36.4 C) (Oral)  Resp 16  Ht '5\' 7"'$  (1.702 m)  Wt 153 lb (69.4 kg)  BMI 23.96 kg/m2 GEN- NAD, alert and oriented x3 HEENT- PERRL, EOMI, non injected sclera, pink conjunctiva, MMM, oropharynx clear Neck- Supple, no thyromegaly CVS- RRR, no murmur RESP-CTAB Psych- stressed appearing, not depressed, no SI/HI, normal speech, well groomed Pulses- Radial 2+        Assessment & Plan:      Problem  List Items Addressed This Visit    Anxiety - Primary    I think her underlying anxieties contributing to most of her symptoms. While her TSH is mildly abnormal I would recheck this along with T3 and T4 to see if this is a true hyperthyroidism or subclinical. Her phosphate was also minimally low but this seems to be nonspecific or calcium and protein levels renal function were all normal. I would have her increase her Prozac to 20 mg once a day and continue her Xanax. She can try taking a half a tablet during the daytime if needed. We will follow-up after her results.       Other Visit Diagnoses    Abnormal TSH        Relevant Orders    Comprehensive metabolic panel    TSH    T3, free    T4, free    Low phosphate levels        Relevant Orders    Phosphorus       Note: This dictation was prepared with Dragon dictation along with smaller phrase technology. Any transcriptional errors that result from this process are unintentional.

## 2015-06-29 ENCOUNTER — Other Ambulatory Visit: Payer: Self-pay | Admitting: Family Medicine

## 2015-06-29 NOTE — Telephone Encounter (Signed)
Ok to refill??  Last office visit 04/16/2015.  Last refill 03/10/2015, #3 refills.

## 2015-06-29 NOTE — Telephone Encounter (Signed)
Medication called to pharmacy. 

## 2015-06-29 NOTE — Telephone Encounter (Signed)
Okay to refill? 

## 2015-07-05 ENCOUNTER — Other Ambulatory Visit: Payer: Self-pay | Admitting: Family Medicine

## 2015-07-05 NOTE — Telephone Encounter (Signed)
Refill appropriate and filled per protocol. 

## 2015-10-19 ENCOUNTER — Other Ambulatory Visit: Payer: Self-pay | Admitting: Family Medicine

## 2015-10-19 NOTE — Telephone Encounter (Signed)
Ok to refill??  Last office visit 04/16/2015.  Last refill 06/29/2015, #2refills.

## 2015-10-19 NOTE — Telephone Encounter (Signed)
Medication called to pharmacy. 

## 2015-10-19 NOTE — Telephone Encounter (Signed)
okay

## 2015-11-22 ENCOUNTER — Other Ambulatory Visit: Payer: Self-pay | Admitting: Family Medicine

## 2015-11-22 NOTE — Telephone Encounter (Signed)
Refill appropriate and filled per protocol. 

## 2016-01-15 ENCOUNTER — Other Ambulatory Visit: Payer: Self-pay | Admitting: Family Medicine

## 2016-01-18 NOTE — Telephone Encounter (Signed)
Ok to refill??  Last office visit 04/20/2015.  Last refill 10/19/2015, #2 refills.

## 2016-01-18 NOTE — Telephone Encounter (Signed)
Okay to refill? 

## 2016-02-12 ENCOUNTER — Other Ambulatory Visit: Payer: Self-pay | Admitting: Family Medicine

## 2016-02-14 NOTE — Telephone Encounter (Signed)
okay

## 2016-02-14 NOTE — Telephone Encounter (Signed)
Ok to refill??  Last office visit 04/16/2015.  Last refill 01/19/2016.

## 2016-02-14 NOTE — Telephone Encounter (Signed)
Medication called to pharmacy. 

## 2016-03-01 ENCOUNTER — Encounter: Payer: Self-pay | Admitting: Family Medicine

## 2016-03-01 ENCOUNTER — Ambulatory Visit (INDEPENDENT_AMBULATORY_CARE_PROVIDER_SITE_OTHER): Payer: PRIVATE HEALTH INSURANCE | Admitting: Family Medicine

## 2016-03-01 VITALS — BP 112/68 | HR 80 | Temp 98.7°F | Resp 16 | Ht 67.0 in | Wt 156.0 lb

## 2016-03-01 DIAGNOSIS — K219 Gastro-esophageal reflux disease without esophagitis: Secondary | ICD-10-CM | POA: Diagnosis not present

## 2016-03-01 DIAGNOSIS — Z1159 Encounter for screening for other viral diseases: Secondary | ICD-10-CM

## 2016-03-01 DIAGNOSIS — Z23 Encounter for immunization: Secondary | ICD-10-CM

## 2016-03-01 DIAGNOSIS — Z1211 Encounter for screening for malignant neoplasm of colon: Secondary | ICD-10-CM

## 2016-03-01 DIAGNOSIS — F330 Major depressive disorder, recurrent, mild: Secondary | ICD-10-CM

## 2016-03-01 DIAGNOSIS — Z1231 Encounter for screening mammogram for malignant neoplasm of breast: Secondary | ICD-10-CM | POA: Diagnosis not present

## 2016-03-01 DIAGNOSIS — Z0000 Encounter for general adult medical examination without abnormal findings: Secondary | ICD-10-CM

## 2016-03-01 DIAGNOSIS — Z1239 Encounter for other screening for malignant neoplasm of breast: Secondary | ICD-10-CM

## 2016-03-01 DIAGNOSIS — R1310 Dysphagia, unspecified: Secondary | ICD-10-CM

## 2016-03-01 DIAGNOSIS — F419 Anxiety disorder, unspecified: Secondary | ICD-10-CM

## 2016-03-01 DIAGNOSIS — R1319 Other dysphagia: Secondary | ICD-10-CM

## 2016-03-01 LAB — CBC WITH DIFFERENTIAL/PLATELET
BASOS PCT: 0 %
Basophils Absolute: 0 cells/uL (ref 0–200)
EOS ABS: 159 {cells}/uL (ref 15–500)
Eosinophils Relative: 3 %
HCT: 42.7 % (ref 35.0–45.0)
Hemoglobin: 14.7 g/dL (ref 12.0–15.0)
LYMPHS PCT: 35 %
Lymphs Abs: 1855 cells/uL (ref 850–3900)
MCH: 30.2 pg (ref 27.0–33.0)
MCHC: 34.4 g/dL (ref 32.0–36.0)
MCV: 87.7 fL (ref 80.0–100.0)
MONOS PCT: 9 %
MPV: 9.8 fL (ref 7.5–12.5)
Monocytes Absolute: 477 cells/uL (ref 200–950)
NEUTROS PCT: 53 %
Neutro Abs: 2809 cells/uL (ref 1500–7800)
PLATELETS: 263 10*3/uL (ref 140–400)
RBC: 4.87 MIL/uL (ref 3.80–5.10)
RDW: 12.2 % (ref 11.0–15.0)
WBC: 5.3 10*3/uL (ref 3.8–10.8)

## 2016-03-01 LAB — COMPREHENSIVE METABOLIC PANEL
ALK PHOS: 102 U/L (ref 33–130)
ALT: 12 U/L (ref 6–29)
AST: 16 U/L (ref 10–35)
Albumin: 4.2 g/dL (ref 3.6–5.1)
BUN: 14 mg/dL (ref 7–25)
CALCIUM: 9.7 mg/dL (ref 8.6–10.4)
CHLORIDE: 104 mmol/L (ref 98–110)
CO2: 26 mmol/L (ref 20–31)
Creat: 0.63 mg/dL (ref 0.50–1.05)
GLUCOSE: 83 mg/dL (ref 70–99)
POTASSIUM: 4.4 mmol/L (ref 3.5–5.3)
Sodium: 138 mmol/L (ref 135–146)
Total Bilirubin: 1.1 mg/dL (ref 0.2–1.2)
Total Protein: 6.7 g/dL (ref 6.1–8.1)

## 2016-03-01 LAB — LIPID PANEL
CHOL/HDL RATIO: 3.9 ratio (ref <=5.0)
Cholesterol: 172 mg/dL (ref 125–200)
HDL: 44 mg/dL — AB (ref >=46)
LDL CALC: 112 mg/dL (ref <130)
Triglycerides: 80 mg/dL (ref <150)
VLDL: 16 mg/dL (ref <30)

## 2016-03-01 LAB — TSH: TSH: 0.33 m[IU]/L — AB

## 2016-03-01 LAB — HEPATITIS C ANTIBODY: HCV AB: NEGATIVE

## 2016-03-01 MED ORDER — FLUOXETINE HCL 20 MG PO CAPS: 20.0000 mg | ORAL_CAPSULE | Freq: Every day | ORAL | 6 refills | Status: DC

## 2016-03-01 MED ORDER — ALPRAZOLAM 1 MG PO TABS: 1.0000 mg | ORAL_TABLET | Freq: Two times a day (BID) | ORAL | 2 refills | Status: DC | PRN

## 2016-03-01 NOTE — Assessment & Plan Note (Signed)
Advised to try Zantac also refer her to gastroenterology for endoscopy with her ongoing dysphasia she is now ready to proceed she will also need colonoscopy.

## 2016-03-01 NOTE — Progress Notes (Signed)
Subjective:    Patient ID: Tracey Fox, female    DOB: 1956/03/10, 60 y.o.   MRN: [id number]  Patient presents for CPE (is fasting)   Pt here for CPE , medications and history reviewed  She is overdue for all of her preventative care. She is a lot of anxiety about going to get testing done but agrees to go have her mammogram and her colonoscopy done. She is status post hysterectomy does not need cervical cancer screening. She does have family history of breast cancer in her sister. She is due for fasting labs today. She is also due for immunizations of tetanus booster and flu shot.  She's been treated long-term for anxiety and depression symptoms she is currently on Prozac 10 mg though I did increase her 20 mg of the visit back in December but she does not recall this. She uses this along with her presently him daily. She thinks that her anxiety is still bad independence and was going on at work she gets even more anxious. She is willing to try the stronger dose of medication.   He continues to have difficulty with swallowing her food in her pills feels like things get stuck it does not happen every day. We actually discussed this a couple years ago but she declined gastroenterology she is now ready to proceed. She also gets some heartburn symptoms I gave her Protonix in the past but she read the side effects and she is concerned it may call dementia therefore she has not been taking this.   Review Of Systems:  GEN- denies fatigue, fever, weight loss,weakness, recent illness HEENT- denies eye drainage, change in vision, nasal discharge, CVS- denies chest pain, palpitations RESP- denies SOB, cough, wheeze ABD- denies N/V, change in stools, abd pain GU- denies dysuria, hematuria, dribbling, incontinence MSK- denies joint pain, muscle aches, injury Neuro- denies headache, dizziness, syncope, seizure activity       Objective:    BP 112/68 (BP Location: Left Arm, Patient Position:  Sitting, Cuff Size: Normal)   Pulse 80   Temp 98.7 F (37.1 C) (Oral)   Resp 16   Ht '5\' 7"'$  (1.702 m)   Wt 156 lb (70.8 kg)   BMI 24.43 kg/m  GEN- NAD, alert and oriented x3 HEENT- PERRL, EOMI, non injected sclera, pink conjunctiva, MMM, oropharynx clear Neck- Supple, no thyromegaly CVS- RRR, no murmur RESP-CTAB ABD-NABS,soft,NT,ND Psych- pleasant, mildly anxious, not depressed appearing, no SI, well groomed  EXT- No edema Pulses- Radial, DP- 2+        Assessment & Plan:      Problem List Items Addressed This Visit    Major depression ([facility])    Anxiety and depression increase Prozac to 20 mg continue the alprazolam      Relevant Medications   ALPRAZolam (XANAX) 1 MG tablet   FLUoxetine (PROZAC) 20 MG capsule   GERD (gastroesophageal reflux disease)    Advised to try Zantac also refer her to gastroenterology for endoscopy with her ongoing dysphasia she is now ready to proceed she will also need colonoscopy.      Dysphagia   Relevant Orders   Ambulatory referral to Gastroenterology   Anxiety   Relevant Medications   ALPRAZolam (XANAX) 1 MG tablet   FLUoxetine (PROZAC) 20 MG capsule    Other Visit Diagnoses    Routine general medical examination at a health care facility    -  Primary   Pt to schedule mammo, referral for colonoscopy, TDAP,.  FLU, hep C screening    Relevant Orders   CBC with Differential/Platelet   Comprehensive metabolic panel   Lipid panel   TSH   Colon cancer screening       Relevant Orders   Ambulatory referral to Gastroenterology   Breast cancer screening       Relevant Orders   MS DIGITAL SCREENING TOMO BILATERAL   Need for hepatitis C screening test       Relevant Orders   Hepatitis C antibody   Need for prophylactic vaccination and inoculation against influenza       Relevant Orders   Flu Vaccine QUAD 36+ mos PF IM (Fluarix & Fluzone Quad PF) (Completed)   Need for prophylactic vaccination with combined diphtheria-tetanus-pertussis  (DTP) vaccine          Note: This dictation was prepared with Dragon dictation along with smaller phrase technology. Any transcriptional errors that result from this process are unintentional.

## 2016-03-01 NOTE — Assessment & Plan Note (Signed)
Anxiety and depression increase Prozac to 20 mg continue the alprazolam

## 2016-03-01 NOTE — Patient Instructions (Addendum)
Mammogram schedule a visit  Referral to colonoscopy AND EGD Prozac is now '20mg'$   Tetanus booster given, Flu shot given Try zantac over the counter  F/U 6 months

## 2016-03-02 ENCOUNTER — Other Ambulatory Visit: Payer: Self-pay | Admitting: Family Medicine

## 2016-03-02 DIAGNOSIS — Z1239 Encounter for other screening for malignant neoplasm of breast: Secondary | ICD-10-CM

## 2016-03-07 ENCOUNTER — Other Ambulatory Visit: Payer: Self-pay | Admitting: *Deleted

## 2016-03-07 DIAGNOSIS — R7989 Other specified abnormal findings of blood chemistry: Secondary | ICD-10-CM

## 2016-03-14 ENCOUNTER — Telehealth: Payer: Self-pay | Admitting: *Deleted

## 2016-03-14 NOTE — Telephone Encounter (Signed)
Call placed to pharmacy.   States that prescription has not been received.   Verbal prescription given again.

## 2016-03-14 NOTE — Telephone Encounter (Signed)
Received VM from patient.   States that she has questions about her referral to [facility].   Please contact her at [phone].

## 2016-03-14 NOTE — Telephone Encounter (Signed)
Received call from patient.   Requested refill on Xanax.   Refill approved on 03/01/2016, #2 refills.   Need to call pharmacy to inquire.

## 2016-03-15 NOTE — Telephone Encounter (Signed)
Pt just needed ph # to call [facility], number given

## 2016-03-16 ENCOUNTER — Encounter: Payer: Self-pay | Admitting: Internal Medicine

## 2016-03-21 ENCOUNTER — Ambulatory Visit
Admission: RE | Admit: 2016-03-21 | Discharge: 2016-03-21 | Disposition: A | Discharge status: Home / Self Care | Payer: PRIVATE HEALTH INSURANCE | Source: Ambulatory Visit | Attending: Family Medicine | Admitting: Family Medicine

## 2016-03-21 DIAGNOSIS — Z1239 Encounter for other screening for malignant neoplasm of breast: Secondary | ICD-10-CM

## 2016-04-17 ENCOUNTER — Other Ambulatory Visit: Payer: PRIVATE HEALTH INSURANCE

## 2016-04-17 DIAGNOSIS — R7989 Other specified abnormal findings of blood chemistry: Secondary | ICD-10-CM

## 2016-04-18 ENCOUNTER — Other Ambulatory Visit: Payer: Self-pay

## 2016-04-18 LAB — TSH: TSH: 0.44 m[IU]/L

## 2016-04-18 LAB — T3, FREE: T3, Free: 3.2 pg/mL (ref 2.3–4.2)

## 2016-04-18 LAB — T4, FREE: Free T4: 0.9 ng/dL (ref 0.8–1.8)

## 2016-04-21 ENCOUNTER — Encounter: Payer: Self-pay | Admitting: *Deleted

## 2016-05-10 ENCOUNTER — Ambulatory Visit: Payer: PRIVATE HEALTH INSURANCE | Admitting: Internal Medicine

## 2016-05-29 ENCOUNTER — Encounter: Payer: Self-pay | Admitting: Family Medicine

## 2016-06-07 ENCOUNTER — Other Ambulatory Visit: Payer: Self-pay | Admitting: Family Medicine

## 2016-06-07 NOTE — Telephone Encounter (Signed)
okay

## 2016-06-07 NOTE — Telephone Encounter (Signed)
Ok to refill??  Last office visit/ refill 03/01/2016, #2 refills.

## 2016-06-08 NOTE — Telephone Encounter (Signed)
Medication called to pharmacy. 

## 2016-09-03 ENCOUNTER — Other Ambulatory Visit: Payer: Self-pay | Admitting: Family Medicine

## 2016-09-04 NOTE — Telephone Encounter (Signed)
Ok to refill??  Last office visit 03/01/2016.  Last refill 06/08/2016, #2 refills.

## 2016-09-04 NOTE — Telephone Encounter (Signed)
okay

## 2016-09-04 NOTE — Telephone Encounter (Signed)
Medication called to pharmacy. 

## 2016-11-01 ENCOUNTER — Ambulatory Visit (HOSPITAL_COMMUNITY)
Admission: RE | Admit: 2016-11-01 | Discharge: 2016-11-01 | Disposition: A | Discharge status: Home / Self Care | Payer: 59 | Source: Ambulatory Visit | Attending: Family Medicine | Admitting: Family Medicine

## 2016-11-01 ENCOUNTER — Encounter: Payer: Self-pay | Admitting: Family Medicine

## 2016-11-01 ENCOUNTER — Ambulatory Visit (INDEPENDENT_AMBULATORY_CARE_PROVIDER_SITE_OTHER): Payer: PRIVATE HEALTH INSURANCE | Admitting: Family Medicine

## 2016-11-01 VITALS — BP 112/68 | HR 68 | Temp 98.0°F | Resp 14 | Ht 67.0 in | Wt 160.0 lb

## 2016-11-01 DIAGNOSIS — R319 Hematuria, unspecified: Secondary | ICD-10-CM | POA: Insufficient documentation

## 2016-11-01 DIAGNOSIS — M545 Low back pain, unspecified: Secondary | ICD-10-CM

## 2016-11-01 DIAGNOSIS — K219 Gastro-esophageal reflux disease without esophagitis: Secondary | ICD-10-CM | POA: Diagnosis not present

## 2016-11-01 DIAGNOSIS — R1310 Dysphagia, unspecified: Secondary | ICD-10-CM

## 2016-11-01 DIAGNOSIS — N289 Disorder of kidney and ureter, unspecified: Secondary | ICD-10-CM | POA: Insufficient documentation

## 2016-11-01 DIAGNOSIS — R1319 Other dysphagia: Secondary | ICD-10-CM

## 2016-11-01 LAB — URINALYSIS, MICROSCOPIC ONLY
CRYSTALS: NONE SEEN [HPF]
Casts: NONE SEEN [LPF]
Yeast: NONE SEEN [HPF]

## 2016-11-01 LAB — URINALYSIS, ROUTINE W REFLEX MICROSCOPIC
Bilirubin Urine: NEGATIVE
Glucose, UA: NEGATIVE
KETONES UR: NEGATIVE
NITRITE: NEGATIVE
Protein, ur: NEGATIVE
SPECIFIC GRAVITY, URINE: 1.02 (ref 1.001–1.035)
pH: 5.5 (ref 5.0–8.0)

## 2016-11-01 MED ORDER — TRAMADOL HCL 50 MG PO TABS: 50.0000 mg | ORAL_TABLET | Freq: Three times a day (TID) | ORAL | 0 refills | Status: DC | PRN

## 2016-11-01 MED ORDER — PANTOPRAZOLE SODIUM 40 MG PO TBEC: 40.0000 mg | DELAYED_RELEASE_TABLET | Freq: Every day | ORAL | 3 refills | Status: DC

## 2016-11-01 NOTE — Patient Instructions (Addendum)
Referral back to GI for your swallowing  Take the protonix for your stomach Ultram at bedtime Tylenol or patch during the day Get the xray of your back/kidney at [facility]  F/U pending results

## 2016-11-01 NOTE — Assessment & Plan Note (Signed)
Recurrent symptoms EGD recommended in past, will send back to GI Restart Protonix for reflux symptoms

## 2016-11-01 NOTE — Progress Notes (Signed)
   Subjective:    Patient ID: Tracey Fox, female    DOB: 16-Oct-1955, 61 y.o.   MRN: [id number]  Patient presents for UC F/U (was seen at [facility] on [facility] for lower back pain, hematuria)   Went to UC last Monday for back discomfort mostlu in center of back and left side. No pain with urination, no gross hematuria. She has been constipated. They did find blood in her urine  Given a shot for pain, prednisone and diclofenac which she took a few days, she noticed the prednisone made her feel confused and jittery, then she lost the diclofenac so has not taken anything by mouth past couple of days,.Currently with lidocaine patch   Hearburn and metallic tast in mouth, feeling like food getting stuck again, has had in the past Mild constipation        Review Of Systems:  GEN- denies fatigue, fever, weight loss,weakness, recent illness HEENT- denies eye drainage, change in vision, nasal discharge, CVS- denies chest pain, palpitations RESP- denies SOB, cough, wheeze ABD- denies N/V, change in stools, abd pain GU- denies dysuria, hematuria, dribbling, incontinence MSK-+ joint pain, muscle aches, injury Neuro- denies headache, dizziness, syncope, seizure activity       Objective:    BP 112/68   Pulse 68   Temp 98 F (36.7 C) (Oral)   Resp 14   Ht 5\' 7"  (1.702 m)   Wt 160 lb (72.6 kg)   SpO2 98%   BMI 25.06 kg/m  GEN- NAD, alert and oriented x3 HEENT- PERRL, EOMI, non injected sclera, pink conjunctiva, MMM, oropharynx clear CVS- RRR, no murmur RESP-CTAB ABD-NABS,soft,, mild TTP left flank and mild TTP suprapubic region MSK- TTP lumbar spine, neg SLR, fair ROM spine Neuro- CNII-XII in tact, motor equal bilat, sensation in tact  EXT- No edema Pulses- Radial, DP- 2+        Assessment & Plan:      Problem List Items Addressed This Visit    Back pain   Relevant Medications   traMADol (ULTRAM) 50 MG tablet   Other Relevant Orders   DG Lumbar Spine  Complete   GERD (gastroesophageal reflux disease)   Relevant Medications   pantoprazole (PROTONIX) 40 MG tablet   Dysphagia    Recurrent symptoms EGD recommended in past, will send back to GI Restart Protonix for reflux symptoms       Other Visit Diagnoses    Hematuria, unspecified type    -  Primary   Concern for possible kidney stone, no true UTI symptoms, but sent for culture. Xray to be done of abd and spine, given ultram   Relevant Orders   Urinalysis, Routine w reflex microscopic (Completed)   Urine Culture   DG Abd 2 Views   DG Lumbar Spine Complete      Note: This dictation was prepared with Dragon dictation along with smaller phrase technology. Any transcriptional errors that result from this process are unintentional.

## 2016-11-02 ENCOUNTER — Other Ambulatory Visit: Payer: Self-pay | Admitting: *Deleted

## 2016-11-02 DIAGNOSIS — N200 Calculus of kidney: Secondary | ICD-10-CM

## 2016-11-02 LAB — URINE CULTURE

## 2016-11-02 MED ORDER — TAMSULOSIN HCL 0.4 MG PO CAPS: 0.4000 mg | ORAL_CAPSULE | Freq: Every day | ORAL | 3 refills | Status: DC

## 2016-11-09 ENCOUNTER — Ambulatory Visit (INDEPENDENT_AMBULATORY_CARE_PROVIDER_SITE_OTHER): Payer: PRIVATE HEALTH INSURANCE | Admitting: Internal Medicine

## 2016-11-09 ENCOUNTER — Other Ambulatory Visit (INDEPENDENT_AMBULATORY_CARE_PROVIDER_SITE_OTHER): Payer: Self-pay | Admitting: Internal Medicine

## 2016-11-09 ENCOUNTER — Encounter (INDEPENDENT_AMBULATORY_CARE_PROVIDER_SITE_OTHER): Payer: Self-pay | Admitting: Internal Medicine

## 2016-11-09 ENCOUNTER — Encounter (INDEPENDENT_AMBULATORY_CARE_PROVIDER_SITE_OTHER): Payer: Self-pay | Admitting: *Deleted

## 2016-11-09 VITALS — BP 94/60 | HR 60 | Temp 98.0°F | Ht 68.0 in | Wt 161.2 lb

## 2016-11-09 DIAGNOSIS — K219 Gastro-esophageal reflux disease without esophagitis: Secondary | ICD-10-CM | POA: Diagnosis not present

## 2016-11-09 DIAGNOSIS — R1319 Other dysphagia: Secondary | ICD-10-CM

## 2016-11-09 DIAGNOSIS — R1310 Dysphagia, unspecified: Secondary | ICD-10-CM | POA: Diagnosis not present

## 2016-11-09 NOTE — Patient Instructions (Signed)
EGD/ED. The risks of bleeding, perforation and infection were reviewed with patient.

## 2016-11-09 NOTE — Progress Notes (Signed)
   Subjective:    Patient ID: Tracey Fox, female    DOB: Apr 19, 1956, 61 y.o.   MRN: [id number]  HPI  Referred by Dr Buelah Manis reflux/dysphagia. She me foods are lodging in her esophagus. She says she panics. Sometimes if she drinks, she can hear a sizzle. She says this occurs about twice a week. She says she has been drinking Nutrition drinks due to the dysphagia.  Appetite is good. No weight loss.  She has a BM x 1 day. No melena or BRRB. She has never had a colonscopy. (States she does want one, either). IMPRESSION: Minimal laryngeal penetration. Otherwise negative exam. Last seen in our office October of 2013. DG Esophagram revealed     Review of Systems     Past Medical History:  Diagnosis Date  . Anxiety   . Depression   . GERD (gastroesophageal reflux disease)     Past Surgical History:  Procedure Laterality Date  . ABDOMINAL HYSTERECTOMY    . BREAST SURGERY    . CHOLECYSTECTOMY      No Known Allergies  Current Outpatient Prescriptions on File Prior to Visit  Medication Sig Dispense Refill  . ALPRAZolam (XANAX) 1 MG tablet TAKE 1 TABLET BY MOUTH TWICE DAILY AS NEEDED FOR ANXIETY 60 tablet 2  . ibuprofen (ADVIL,MOTRIN) 200 MG tablet Take 200 mg by mouth every 6 (six) hours as needed.    . pantoprazole (PROTONIX) 40 MG tablet Take 1 tablet (40 mg total) by mouth daily. 30 tablet 3  . traMADol (ULTRAM) 50 MG tablet Take 1 tablet (50 mg total) by mouth every 8 (eight) hours as needed. 20 tablet 0   No current facility-administered medications on file prior to visit.      Objective:   Physical Exam Blood pressure 94/60, pulse 60, temperature 98 F (36.7 C), height 5\' 8"  (1.727 m), weight 161 lb 3.2 oz (73.1 kg).   Alert and oriented. Skin warm and dry. Oral mucosa is moist.   . Sclera anicteric, conjunctivae is pink. Thyroid not enlarged. No cervical lymphadenopathy. Lungs clear. Heart regular rate and rhythm.  Abdomen is soft. Bowel sounds are positive. No  hepatomegaly. No abdominal masses felt. No tenderness.  No edema to lower extremities.        Assessment & Plan:  Solid food dysphagia.  Will schedule an EGD/ED.  The risks of bleeding, perforation and infection were reviewed with patient.

## 2016-11-13 ENCOUNTER — Telehealth: Payer: Self-pay | Admitting: *Deleted

## 2016-11-13 DIAGNOSIS — M479 Spondylosis, unspecified: Secondary | ICD-10-CM

## 2016-11-13 DIAGNOSIS — M549 Dorsalgia, unspecified: Secondary | ICD-10-CM

## 2016-11-13 NOTE — Telephone Encounter (Signed)
Okay to send referral Agree to stop flomax, when he looked at the xrays and sent for scan, stone was not in the kidneys Okay to continue ultram and refill if needed this is for pain,can also take Aleve or ibuprofen with food  Dx- back pain, spondylosis

## 2016-11-13 NOTE — Telephone Encounter (Signed)
Received call from patient.   States that she was seen by urology and was advised that no calcifications were noted. Advised to stop Flomax.   Reports that back pain has not improved and requested referral to orthopedics.   MD please advise.

## 2016-11-13 NOTE — Telephone Encounter (Signed)
Call placed to patient and patient made aware.   Referral orders placed.  

## 2016-11-14 ENCOUNTER — Encounter (HOSPITAL_COMMUNITY): Payer: Self-pay | Admitting: Emergency Medicine

## 2016-11-14 ENCOUNTER — Emergency Department (HOSPITAL_COMMUNITY)
Admission: EM | Admit: 2016-11-14 | Discharge: 2016-11-14 | Disposition: A | Discharge status: Home / Self Care | Payer: 59 | Attending: Emergency Medicine | Admitting: Emergency Medicine

## 2016-11-14 DIAGNOSIS — Z79899 Other long term (current) drug therapy: Secondary | ICD-10-CM | POA: Diagnosis not present

## 2016-11-14 DIAGNOSIS — M5442 Lumbago with sciatica, left side: Secondary | ICD-10-CM | POA: Insufficient documentation

## 2016-11-14 DIAGNOSIS — M545 Low back pain: Secondary | ICD-10-CM | POA: Diagnosis present

## 2016-11-14 MED ORDER — MORPHINE SULFATE (PF) 4 MG/ML IV SOLN
6.0000 mg | Freq: Once | INTRAVENOUS | Status: AC
  Administered 2016-11-14: 6 mg via INTRAMUSCULAR
  Filled 2016-11-14: qty 2

## 2016-11-14 MED ORDER — ONDANSETRON 4 MG PO TBDP
4.0000 mg | ORAL_TABLET | Freq: Once | ORAL | Status: AC
  Administered 2016-11-14: 4 mg via ORAL
  Filled 2016-11-14: qty 1

## 2016-11-14 MED ORDER — METHOCARBAMOL 500 MG PO TABS: 500.0000 mg | ORAL_TABLET | Freq: Three times a day (TID) | ORAL | 0 refills | Status: DC

## 2016-11-14 MED ORDER — HYDROCODONE-ACETAMINOPHEN 5-325 MG PO TABS: ORAL_TABLET | ORAL | 0 refills | Status: DC

## 2016-11-14 NOTE — ED Notes (Signed)
Pt ambulatory to waiting room. Pt verbalized understanding of discharge instructions.   

## 2016-11-14 NOTE — ED Provider Notes (Signed)
[facility] DEPT Provider Note   CSN: [id number] Arrival date & time: 11/14/16  1853     History   Chief Complaint Chief Complaint  Patient presents with  . Back Pain    HPI Tracey Fox is a 61 y.o. female.  HPI   Tracey Fox is a 61 y.o. female who presents to the Emergency Department complaining of biltaerl lower back pain for 3.5 weeks.  She stattes that she was seen at urgent care and felt to have kidney stones.  She then saw urology and told that she had no evidence of kidney stones.  She describes a constant, aching pain to right and left lower back, but worse on left.  Pain associated with movement.  She denies known injury.  Pain worse with weight bearing and bending.  Pain has been unrelieved with ultram.  She states that her PCP has made a referral to orthopedics but appt time has not been set.  She denies fever, urine or bowel changes, abd pain, numbness or weakness of the lower extremities.    Past Medical History:  Diagnosis Date  . Anxiety   . Depression   . GERD (gastroesophageal reflux disease)     Patient Active Problem List   Diagnosis Date Noted  . Esophageal dysphagia 11/09/2016  . Infected cyst of skin 12/10/2013  . Back pain 12/10/2013  . Major depression ([place]) 07/06/2013  . Chest pain 03/12/2013  . Atypical chest pain 02/16/2013  . Family history of MI (myocardial infarction) 02/16/2013  . Wheezing 01/02/2013  . Grief reaction 08/05/2012  . Dysphagia 12/05/2011  . Anxiety 12/05/2011  . GERD (gastroesophageal reflux disease) 12/05/2011  . Pulmonary nodules 12/05/2011  . Dyspnea 12/03/2011  . Abnormal CT scan, chest 12/03/2011    Past Surgical History:  Procedure Laterality Date  . ABDOMINAL HYSTERECTOMY    . BREAST SURGERY    . CHOLECYSTECTOMY      OB History    No data available       Home Medications    Prior to Admission medications   Medication Sig Start Date End Date Taking? Authorizing Provider  ALPRAZolam  Duanne Moron) 1 MG tablet TAKE 1 TABLET BY MOUTH TWICE DAILY AS NEEDED FOR ANXIETY 09/04/16   Alycia Rossetti, MD  HYDROcodone-acetaminophen (NORCO/VICODIN) 5-325 MG tablet Take one tab po q 4-6 hrs prn pain 11/14/16   Mallary Kreger, PA-C  ibuprofen (ADVIL,MOTRIN) 200 MG tablet Take 200 mg by mouth every 6 (six) hours as needed.    [provider]  methocarbamol (ROBAXIN) 500 MG tablet Take 1 tablet (500 mg total) by mouth 3 (three) times daily. 11/14/16   Huntington Leverich, PA-C  pantoprazole (PROTONIX) 40 MG tablet Take 1 tablet (40 mg total) by mouth daily. 11/01/16   Alycia Rossetti, MD    Family History Family History  Problem Relation Age of Onset  . Heart disease Father   . Breast cancer Sister   . Cancer Sister   . COPD Brother        oldest brother    Social History Social History  Substance Use Topics  . Smoking status: Never Smoker  . Smokeless tobacco: Never Used  . Alcohol use Yes     Comment: occ     Allergies   Patient has no known allergies.   Review of Systems Review of Systems  Constitutional: Negative for fever.  Respiratory: Negative for shortness of breath.   Gastrointestinal: Negative for abdominal pain, constipation and vomiting.  Genitourinary: Negative for decreased urine volume, difficulty urinating, dysuria, flank pain and hematuria.  Musculoskeletal: Positive for back pain. Negative for joint swelling.  Skin: Negative for rash.  Neurological: Negative for weakness and numbness.  All other systems reviewed and are negative.    Physical Exam Updated Vital Signs BP 133/83 (BP Location: Right Arm)   Pulse 62   Temp 97.7 F (36.5 C) (Oral)   Resp 17   Ht 5\' 8"  (1.727 m)   Wt 68 kg (150 lb)   SpO2 95%   BMI 22.81 kg/m   Physical Exam  Constitutional: She is oriented to person, place, and time. She appears well-developed and well-nourished. No distress.  HENT:  Head: Normocephalic and atraumatic.  Neck: Normal range of motion. Neck  supple.  Cardiovascular: Normal rate, regular rhythm, normal heart sounds and intact distal pulses.   No murmur heard. Pulmonary/Chest: Effort normal and breath sounds normal. No respiratory distress.  Abdominal: Soft. She exhibits no distension. There is no tenderness.  Musculoskeletal: She exhibits tenderness. She exhibits no edema.       Lumbar back: She exhibits tenderness and pain. She exhibits normal range of motion, no swelling, no deformity, no laceration and normal pulse.  ttp of the bitleral lumbar paraspinal muscles, left > right.  No spinal tenderness.  Pt has 5/5 strength against resistance of bilateral lower extremities.     Neurological: She is alert and oriented to person, place, and time. She has normal strength. No sensory deficit. She exhibits normal muscle tone. Coordination and gait normal.  Reflex Scores:      Patellar reflexes are 2+ on the right side and 2+ on the left side.      Achilles reflexes are 2+ on the right side and 2+ on the left side. Skin: Skin is warm and dry. Capillary refill takes less than 2 seconds. No rash noted.  Nursing note and vitals reviewed.    ED Treatments / Results  Labs (all labs ordered are listed, but only abnormal results are displayed) Labs Reviewed - No data to display  EKG  EKG Interpretation None       Radiology No results found.  Procedures Procedures (including critical care time)  Medications Ordered in ED Medications  morphine 4 MG/ML injection 6 mg (6 mg Intramuscular Given 11/14/16 1959)  ondansetron (ZOFRAN-ODT) disintegrating tablet 4 mg (4 mg Oral Given 11/14/16 2000)     Initial Impression / Assessment and Plan / ED Course  I have reviewed the triage vital signs and the nursing notes.  Pertinent labs & imaging results that were available during my care of the patient were reviewed by me and considered in my medical decision making (see chart for details).     Patient ambulates with a steady gait. No  focal neuro deficits. No concerning symptoms for emergent neurological process. Pt's PCP aware of back pain and had submitted referral to orthopedics.  Return precautions discussed   Final Clinical Impressions(s) / ED Diagnoses   Final diagnoses:  Acute bilateral low back pain with left-sided sciatica    New Prescriptions New Prescriptions   HYDROCODONE-ACETAMINOPHEN (NORCO/VICODIN) 5-325 MG TABLET    Take one tab po q 4-6 hrs prn pain   METHOCARBAMOL (ROBAXIN) 500 MG TABLET    Take 1 tablet (500 mg total) by mouth 3 (three) times daily.     Kem Parkinson, PA-C 11/16/16 Velta Addison    Noemi Chapel, MD 11/17/16 [phone]

## 2016-11-14 NOTE — Discharge Instructions (Signed)
Apply ice packs on and off tear back. Stop the Ultram. Continue ibuprofen 800 mg 3 times a day with food. Follow-up with her primary doctor on Thursday for recheck

## 2016-11-14 NOTE — ED Triage Notes (Signed)
Pt c/o lower back pain x3.5weeks. Pt has been seen for the same here.

## 2016-11-15 ENCOUNTER — Encounter (HOSPITAL_COMMUNITY): Payer: Self-pay | Admitting: *Deleted

## 2016-11-15 ENCOUNTER — Emergency Department (HOSPITAL_COMMUNITY): Payer: No Typology Code available for payment source

## 2016-11-15 ENCOUNTER — Emergency Department (HOSPITAL_COMMUNITY)
Admission: EM | Admit: 2016-11-15 | Discharge: 2016-11-15 | Disposition: A | Discharge status: Home / Self Care | Payer: No Typology Code available for payment source | Attending: Emergency Medicine | Admitting: Emergency Medicine

## 2016-11-15 DIAGNOSIS — M545 Low back pain, unspecified: Secondary | ICD-10-CM

## 2016-11-15 DIAGNOSIS — K529 Noninfective gastroenteritis and colitis, unspecified: Secondary | ICD-10-CM | POA: Diagnosis not present

## 2016-11-15 DIAGNOSIS — Z79899 Other long term (current) drug therapy: Secondary | ICD-10-CM | POA: Insufficient documentation

## 2016-11-15 DIAGNOSIS — R1110 Vomiting, unspecified: Secondary | ICD-10-CM | POA: Diagnosis present

## 2016-11-15 LAB — COMPREHENSIVE METABOLIC PANEL
ALT: 26 U/L (ref 14–54)
ANION GAP: 9 (ref 5–15)
AST: 23 U/L (ref 15–41)
Albumin: 4.1 g/dL (ref 3.5–5.0)
Alkaline Phosphatase: 93 U/L (ref 38–126)
BUN: 15 mg/dL (ref 6–20)
CHLORIDE: 104 mmol/L (ref 101–111)
CO2: 22 mmol/L (ref 22–32)
CREATININE: 0.68 mg/dL (ref 0.44–1.00)
Calcium: 9.2 mg/dL (ref 8.9–10.3)
Glucose, Bld: 159 mg/dL — ABNORMAL HIGH (ref 65–99)
Potassium: 3.2 mmol/L — ABNORMAL LOW (ref 3.5–5.1)
Sodium: 135 mmol/L (ref 135–145)
Total Bilirubin: 1.4 mg/dL — ABNORMAL HIGH (ref 0.3–1.2)
Total Protein: 7 g/dL (ref 6.5–8.1)

## 2016-11-15 LAB — CBC WITH DIFFERENTIAL/PLATELET
BASOS ABS: 0 10*3/uL (ref 0.0–0.1)
Basophils Relative: 0 %
EOS ABS: 0 10*3/uL (ref 0.0–0.7)
EOS PCT: 0 %
HCT: 39.5 % (ref 36.0–46.0)
Hemoglobin: 13.6 g/dL (ref 12.0–15.0)
LYMPHS PCT: 14 %
Lymphs Abs: 1 10*3/uL (ref 0.7–4.0)
MCH: 30.3 pg (ref 26.0–34.0)
MCHC: 34.4 g/dL (ref 30.0–36.0)
MCV: 88 fL (ref 78.0–100.0)
Monocytes Absolute: 0.5 10*3/uL (ref 0.1–1.0)
Monocytes Relative: 6 %
NEUTROS PCT: 80 %
Neutro Abs: 5.9 10*3/uL (ref 1.7–7.7)
PLATELETS: 240 10*3/uL (ref 150–400)
RBC: 4.49 MIL/uL (ref 3.87–5.11)
RDW: 11.6 % (ref 11.5–15.5)
WBC: 7.4 10*3/uL (ref 4.0–10.5)

## 2016-11-15 LAB — URINALYSIS, ROUTINE W REFLEX MICROSCOPIC
BILIRUBIN URINE: NEGATIVE
GLUCOSE, UA: NEGATIVE mg/dL
KETONES UR: 20 mg/dL — AB
NITRITE: NEGATIVE
PROTEIN: NEGATIVE mg/dL
Specific Gravity, Urine: 1.013 (ref 1.005–1.030)
pH: 7 (ref 5.0–8.0)

## 2016-11-15 LAB — LIPASE, BLOOD: LIPASE: 24 U/L (ref 11–51)

## 2016-11-15 MED ORDER — ONDANSETRON 8 MG PO TBDP
8.0000 mg | ORAL_TABLET | Freq: Once | ORAL | Status: AC
  Administered 2016-11-15: 8 mg via ORAL
  Filled 2016-11-15: qty 1

## 2016-11-15 MED ORDER — POTASSIUM CHLORIDE CRYS ER 20 MEQ PO TBCR
40.0000 meq | EXTENDED_RELEASE_TABLET | Freq: Once | ORAL | Status: AC
  Administered 2016-11-15: 40 meq via ORAL
  Filled 2016-11-15: qty 2

## 2016-11-15 MED ORDER — PROMETHAZINE HCL 25 MG PO TABS: 25.0000 mg | ORAL_TABLET | Freq: Four times a day (QID) | ORAL | 0 refills | Status: DC | PRN

## 2016-11-15 MED ORDER — NALOXONE HCL 0.4 MG/ML IJ SOLN
0.4000 mg | Freq: Once | INTRAMUSCULAR | Status: AC
  Administered 2016-11-15: 0.4 mg via INTRAMUSCULAR
  Filled 2016-11-15: qty 1

## 2016-11-15 MED ORDER — PROMETHAZINE HCL 12.5 MG PO TABS
25.0000 mg | ORAL_TABLET | Freq: Once | ORAL | Status: AC
  Administered 2016-11-15: 25 mg via ORAL
  Filled 2016-11-15: qty 2

## 2016-11-15 MED ORDER — CIPROFLOXACIN HCL 500 MG PO TABS: 500.0000 mg | ORAL_TABLET | Freq: Two times a day (BID) | ORAL | 0 refills | Status: DC

## 2016-11-15 MED ORDER — METRONIDAZOLE 500 MG PO TABS: 500.0000 mg | ORAL_TABLET | Freq: Three times a day (TID) | ORAL | 0 refills | Status: DC

## 2016-11-15 NOTE — ED Notes (Signed)
Pt c/o of continued nausea- Dr Thurnell Garbe aware- new orders received.

## 2016-11-15 NOTE — ED Notes (Signed)
Patient transported to CT 

## 2016-11-15 NOTE — ED Triage Notes (Signed)
Pt seen here x 3 hours ago and given morphine for back pain; pt having n/v since then

## 2016-11-15 NOTE — ED Notes (Signed)
Pt has drank one cup of water. Pt on phone at this time.

## 2016-11-15 NOTE — ED Provider Notes (Signed)
[facility] DEPT Provider Note   CSN: [id number] Arrival date & time: 11/15/16  0120     History   Chief Complaint Chief Complaint  Patient presents with  . Emesis    HPI Tracey Fox is a 61 y.o. female.  HPI  Pt was seen at [facility]. Per pt, c/o gradual onset and persistence of constant generalized weakness and one episode of N/V since receiving IM morphine and being discharged from the ED 3-4 hours ago. Pt also continues to c/o left sided LBP, present for the past 3-4 weeks. Pt has been evaluated by multiple providers, including PMD, UCC, Uro MD, and ED, without definitive diagnosis. Pt has been referred to Ortho MD for her LBP. Denies any change in her chronic pain pattern.  Pain worsens with palpation of the area and body position changes. Denies incont/retention of bowel or bladder, no saddle anesthesia, no focal motor weakness, no tingling/numbness in extremities, no fevers, no injury, no abd pain, no diarrhea, no CP/SOB.     Past Medical History:  Diagnosis Date  . Anxiety   . Depression   . GERD (gastroesophageal reflux disease)     Patient Active Problem List   Diagnosis Date Noted  . Esophageal dysphagia 11/09/2016  . Infected cyst of skin 12/10/2013  . Back pain 12/10/2013  . Major depression ([place]) 07/06/2013  . Chest pain 03/12/2013  . Atypical chest pain 02/16/2013  . Family history of MI (myocardial infarction) 02/16/2013  . Wheezing 01/02/2013  . Grief reaction 08/05/2012  . Dysphagia 12/05/2011  . Anxiety 12/05/2011  . GERD (gastroesophageal reflux disease) 12/05/2011  . Pulmonary nodules 12/05/2011  . Dyspnea 12/03/2011  . Abnormal CT scan, chest 12/03/2011    Past Surgical History:  Procedure Laterality Date  . ABDOMINAL HYSTERECTOMY    . BREAST SURGERY    . CHOLECYSTECTOMY      OB History    No data available       Home Medications    Prior to Admission medications   Medication Sig Start Date End Date Taking? Authorizing Provider   ALPRAZolam Duanne Moron) 1 MG tablet TAKE 1 TABLET BY MOUTH TWICE DAILY AS NEEDED FOR ANXIETY 09/04/16   Alycia Rossetti, MD  HYDROcodone-acetaminophen (NORCO/VICODIN) 5-325 MG tablet Take one tab po q 4-6 hrs prn pain 11/14/16   Triplett, Tammy, PA-C  ibuprofen (ADVIL,MOTRIN) 200 MG tablet Take 200 mg by mouth every 6 (six) hours as needed.    [provider]  methocarbamol (ROBAXIN) 500 MG tablet Take 1 tablet (500 mg total) by mouth 3 (three) times daily. 11/14/16   Triplett, Tammy, PA-C  pantoprazole (PROTONIX) 40 MG tablet Take 1 tablet (40 mg total) by mouth daily. 11/01/16   Alycia Rossetti, MD    Family History Family History  Problem Relation Age of Onset  . Heart disease Father   . Breast cancer Sister   . Cancer Sister   . COPD Brother        oldest brother    Social History Social History  Substance Use Topics  . Smoking status: Never Smoker  . Smokeless tobacco: Never Used  . Alcohol use Yes     Comment: occ     Allergies   Patient has no known allergies.   Review of Systems Review of Systems ROS: Statement: All systems negative except as marked or noted in the HPI; Constitutional: Negative for fever and chills. +generalized weakness. ; ; Eyes: Negative for eye pain, redness and discharge. ; ;  ENMT: Negative for ear pain, hoarseness, nasal congestion, sinus pressure and sore throat. ; ; Cardiovascular: Negative for chest pain, palpitations, diaphoresis, dyspnea and peripheral edema. ; ; Respiratory: Negative for cough, wheezing and stridor. ; ; Gastrointestinal: +N/V. Negative for diarrhea, abdominal pain, blood in stool, hematemesis, jaundice and rectal bleeding. . ; ; Genitourinary: Negative for dysuria, flank pain and hematuria. ; ; Musculoskeletal: +LBP. Negative for neck pain. Negative for swelling and trauma.; ; Skin: Negative for pruritus, rash, abrasions, blisters, bruising and skin lesion.; ; Neuro: Negative for headache, lightheadedness and neck stiffness.  Negative for weakness, altered level of consciousness, altered mental status, extremity weakness, paresthesias, involuntary movement, seizure and syncope.       Physical Exam Updated Vital Signs BP 131/71   Pulse 73   Temp 97.8 F (36.6 C) (Oral)   Resp 18   Ht 5\' 8"  (1.727 m)   Wt 70.3 kg (155 lb)   SpO2 99%   BMI 23.57 kg/m   Physical Exam 0150: Physical examination:  Nursing notes reviewed; Vital signs and O2 SAT reviewed;  Constitutional: Well developed, Well nourished, Well hydrated, In no acute distress; Head:  Normocephalic, atraumatic; Eyes: EOMI, PERRL, No scleral icterus; ENMT: Mouth and pharynx normal, Mucous membranes moist; Neck: Supple, Full range of motion, No lymphadenopathy; Cardiovascular: Regular rate and rhythm, No gallop; Respiratory: Breath sounds clear & equal bilaterally, No wheezes.  Speaking full sentences with ease, Normal respiratory effort/excursion; Chest: Nontender, Movement normal; Abdomen: Soft, Nontender, Nondistended, Normal bowel sounds; Genitourinary: No CVA tenderness; Spine:  No midline CS, TS, LS tenderness. +TTP left lumbar paraspinal muscles.;; Extremities: Pulses normal, No tenderness, No edema, No calf edema or asymmetry.; Neuro: AA&Ox3, Major CN grossly intact.  Speech clear. No gross focal motor or sensory deficits in extremities.; Skin: Color normal, Warm, Dry.   ED Treatments / Results  Labs (all labs ordered are listed, but only abnormal results are displayed)   EKG  EKG Interpretation None       Radiology   Procedures Procedures (including critical care time)  Medications Ordered in ED Medications  ondansetron (ZOFRAN-ODT) disintegrating tablet 8 mg (8 mg Oral Given 11/15/16 0156)  naloxone (NARCAN) injection 0.4 mg (0.4 mg Intramuscular Given 11/15/16 0156)     Initial Impression / Assessment and Plan / ED Course  I have reviewed the triage vital signs and the nursing notes.  Pertinent labs & imaging results that were  available during my care of the patient were reviewed by me and considered in my medical decision making (see chart for details).  MDM Reviewed: previous chart, nursing note and vitals Reviewed previous: labs Interpretation: labs and CT scan   Results for orders placed or performed during the hospital encounter of 11/15/16  Urinalysis, Routine w reflex microscopic  Result Value Ref Range   Color, Urine YELLOW YELLOW   APPearance HAZY (A) CLEAR   Specific Gravity, Urine 1.013 1.005 - 1.030   pH 7.0 5.0 - 8.0   Glucose, UA NEGATIVE NEGATIVE mg/dL   Hgb urine dipstick MODERATE (A) NEGATIVE   Bilirubin Urine NEGATIVE NEGATIVE   Ketones, ur 20 (A) NEGATIVE mg/dL   Protein, ur NEGATIVE NEGATIVE mg/dL   Nitrite NEGATIVE NEGATIVE   Leukocytes, UA LARGE (A) NEGATIVE   RBC / HPF 0-5 0 - 5 RBC/hpf   WBC, UA 6-30 0 - 5 WBC/hpf   Bacteria, UA RARE (A) NONE SEEN   Squamous Epithelial / LPF 0-5 (A) NONE SEEN   Hyaline Casts, UA PRESENT  Comprehensive metabolic panel  Result Value Ref Range   Sodium 135 135 - 145 mmol/L   Potassium 3.2 (L) 3.5 - 5.1 mmol/L   Chloride 104 101 - 111 mmol/L   CO2 22 22 - 32 mmol/L   Glucose, Bld 159 (H) 65 - 99 mg/dL   BUN 15 6 - 20 mg/dL   Creatinine, Ser 0.68 0.44 - 1.00 mg/dL   Calcium 9.2 8.9 - 10.3 mg/dL   Total Protein 7.0 6.5 - 8.1 g/dL   Albumin 4.1 3.5 - 5.0 g/dL   AST 23 15 - 41 U/L   ALT 26 14 - 54 U/L   Alkaline Phosphatase 93 38 - 126 U/L   Total Bilirubin 1.4 (H) 0.3 - 1.2 mg/dL   GFR calc non Af Amer >60 >60 mL/min   GFR calc Af Amer >60 >60 mL/min   Anion gap 9 5 - 15  Lipase, blood  Result Value Ref Range   Lipase 24 11 - 51 U/L  CBC with Differential  Result Value Ref Range   WBC 7.4 4.0 - 10.5 K/uL   RBC 4.49 3.87 - 5.11 MIL/uL   Hemoglobin 13.6 12.0 - 15.0 g/dL   HCT 39.5 36.0 - 46.0 %   MCV 88.0 78.0 - 100.0 fL   MCH 30.3 26.0 - 34.0 pg   MCHC 34.4 30.0 - 36.0 g/dL   RDW 11.6 11.5 - 15.5 %   Platelets 240 150 - 400 K/uL     Neutrophils Relative % 80 %   Neutro Abs 5.9 1.7 - 7.7 K/uL   Lymphocytes Relative 14 %   Lymphs Abs 1.0 0.7 - 4.0 K/uL   Monocytes Relative 6 %   Monocytes Absolute 0.5 0.1 - 1.0 K/uL   Eosinophils Relative 0 %   Eosinophils Absolute 0.0 0.0 - 0.7 K/uL   Basophils Relative 0 %   Basophils Absolute 0.0 0.0 - 0.1 K/uL    Ct Renal Stone Study Result Date: 11/15/2016 CLINICAL DATA:  Acute onset of nausea and vomiting. Worsening lower back pain. EXAM: CT ABDOMEN AND PELVIS WITHOUT CONTRAST TECHNIQUE: Multidetector CT imaging of the abdomen and pelvis was performed following the standard protocol without IV contrast. COMPARISON:  CT of the abdomen and pelvis performed 11/07/2016 FINDINGS: Lower chest: Solid material is noted within the distal esophagus, raising question for mild esophageal dysmotility or gastroesophageal reflux. Minimal interstitial prominence is noted at the lung bases. The visualized portions of the mediastinum are grossly unremarkable. Hepatobiliary: The liver is unremarkable in appearance. The patient is status post cholecystectomy, with clips noted at the gallbladder fossa. The common bile duct remains normal in caliber. Pancreas: The pancreas is within normal limits. Spleen: The spleen is unremarkable in appearance. Adrenals/Urinary Tract: The adrenal glands are unremarkable in appearance. The kidneys are within normal limits. There is no evidence of hydronephrosis. No renal or ureteral stones are identified. No perinephric stranding is seen. Stomach/Bowel: The stomach is unremarkable in appearance. The small bowel is within normal limits. The appendix is not visualized; there is no evidence for appendicitis. Mild wall thickening is noted at the hepatic flexure of the colon, raising question for a mild infectious or inflammatory process. Minimal diverticulosis is noted along the proximal sigmoid colon, without evidence of diverticulitis. Vascular/Lymphatic: The abdominal aorta is  unremarkable in appearance. The inferior vena cava is grossly unremarkable. No retroperitoneal lymphadenopathy is seen. No pelvic sidewall lymphadenopathy is identified. Reproductive: Is the bladder is mildly distended and grossly unremarkable. The patient is  status post hysterectomy. No suspicious adnexal masses are seen. The ovaries are grossly symmetric. Other: No additional soft tissue abnormalities are seen. Musculoskeletal: No acute osseous abnormalities are identified. The visualized musculature is unremarkable in appearance. IMPRESSION: 1. Wall thickening at the hepatic flexure of the colon raises question for a mild infectious or inflammatory process. This is new from the recent prior CT. 2. Solid material within the distal esophagus raises question for mild esophageal dysmotility or gastroesophageal reflux. 3. Minimal interstitial prominence at the lung bases. Electronically Signed   By: Garald Balding M.D.   On: 11/15/2016 02:49     0530:  CT scan with possible colitis; will tx with cipro/flagyl. Potassium repleted PO. Pt has tol PO well while in the ED without N/V.  No stooling while in the ED.  Abd remains benign, VSS. Ready to go home now. Dx and testing d/w pt and family.  Questions answered.  Verb understanding, agreeable to d/c home with outpt f/u.     Final Clinical Impressions(s) / ED Diagnoses   Final diagnoses:  None    New Prescriptions New Prescriptions   No medications on file     Francine Graven, DO 11/19/16 1109

## 2016-11-15 NOTE — Discharge Instructions (Signed)
Take the prescriptions as directed.  Apply moist heat or ice to the area(s) of discomfort, for 15 minutes at a time, several times per day for the next few days.  Do not fall asleep on a heating or ice pack.  Call your regular medical doctor tomorrow to schedule a follow up appointment in the next 2 days.  Return to the Emergency Department immediately if worsening.

## 2016-11-16 ENCOUNTER — Telehealth: Payer: Self-pay | Admitting: *Deleted

## 2016-11-16 LAB — URINE CULTURE

## 2016-11-16 NOTE — Telephone Encounter (Signed)
Received call from patient.   Inquired as to status of referral for back pain.   Advised that referral has been placed to Dr. Aline Brochure. Awaiting appointment to be scheduled.

## 2016-11-23 ENCOUNTER — Encounter: Payer: Self-pay | Admitting: Orthopaedic Surgery

## 2016-11-23 ENCOUNTER — Ambulatory Visit (INDEPENDENT_AMBULATORY_CARE_PROVIDER_SITE_OTHER): Payer: PRIVATE HEALTH INSURANCE | Admitting: Orthopaedic Surgery

## 2016-11-23 VITALS — BP 111/80 | HR 60 | Temp 97.7°F | Ht 68.0 in | Wt 156.0 lb

## 2016-11-23 DIAGNOSIS — M545 Low back pain: Secondary | ICD-10-CM

## 2016-11-23 DIAGNOSIS — G8929 Other chronic pain: Secondary | ICD-10-CM

## 2016-11-23 MED ORDER — NAPROXEN 500 MG PO TABS: 500.0000 mg | ORAL_TABLET | Freq: Two times a day (BID) | ORAL | 5 refills | Status: DC

## 2016-11-23 NOTE — Progress Notes (Signed)
Subjective:    Patient ID: Tracey Fox, female    DOB: 09/17/1955, 61 y.o.   MRN: [id number]  HPI She has had lower back pain, more on the left side, for several weeks now.  She has been seen at Urgent Care and then by Dr. Buelah Manis.  She saw Dr. Buelah Manis on 11-01-16.  X-rays showed: IMPRESSION: 1. Dextroscoliosis with mild degenerate spondylolysis at L2-3 through L4-5. 2. Subcentimeter calcific densities overlying the left kidney and right transverse process of L5, described on concomitant radiograph of the abdomen.  She has continued pain.  She has no paresthesias.  She has no trauma.  She has no weakness.  She has more pain as the day goes on. She has no bowel or bladder problem.  She is concerned about the scoliosis.  I have told her to check her three daughters for scoliosis.  She took prednisone but it made her jittery.  She was given Rx for diclofenac but did not fill it.  She has been on muscle relaxants in the past and they did not agree with her.  I will begin PT and Naprosyn.  I went over precautions with her.  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   Review of Systems  HENT: Negative for congestion.   Respiratory: Negative for cough and shortness of breath.   Cardiovascular: Negative for chest pain and leg swelling.  Endocrine: Positive for cold intolerance.  Musculoskeletal: Positive for arthralgias and back pain.  Allergic/Immunologic: Positive for environmental allergies.  Psychiatric/Behavioral: The patient is nervous/anxious.    Past Medical History:  Diagnosis Date  . Anxiety   . Depression   . GERD (gastroesophageal reflux disease)     Past Surgical History:  Procedure Laterality Date  . ABDOMINAL HYSTERECTOMY    . BREAST SURGERY    . CHOLECYSTECTOMY      Current Outpatient Prescriptions on File Prior to Visit  Medication Sig Dispense Refill  . ALPRAZolam (XANAX) 1 MG tablet TAKE 1 TABLET BY MOUTH TWICE DAILY  AS NEEDED FOR ANXIETY 60 tablet 2  . ciprofloxacin (CIPRO) 500 MG tablet Take 1 tablet (500 mg total) by mouth 2 (two) times daily. 14 tablet 0  . HYDROcodone-acetaminophen (NORCO/VICODIN) 5-325 MG tablet Take one tab po q 4-6 hrs prn pain 12 tablet 0  . methocarbamol (ROBAXIN) 500 MG tablet Take 1 tablet (500 mg total) by mouth 3 (three) times daily. 21 tablet 0  . metroNIDAZOLE (FLAGYL) 500 MG tablet Take 1 tablet (500 mg total) by mouth 3 (three) times daily. 21 tablet 0  . pantoprazole (PROTONIX) 40 MG tablet Take 1 tablet (40 mg total) by mouth daily. 30 tablet 3  . promethazine (PHENERGAN) 25 MG tablet Take 1 tablet (25 mg total) by mouth every 6 (six) hours as needed for nausea or vomiting. 8 tablet 0   No current facility-administered medications on file prior to visit.     Social History   Social History  . Marital status: Divorced    Spouse name: N/A  . Number of children: 4  . Years of education: N/A   Occupational History  . office manager [employer] History Main Topics  . Smoking status: Never Smoker  . Smokeless tobacco: Never Used  . Alcohol use Yes     Comment: occ  . Drug use: No  . Sexual activity: Not on file   Other Topics Concern  . Not on  file   Social History Narrative  . No narrative on file    Family History  Problem Relation Age of Onset  . Heart disease Father   . Breast cancer Sister   . Cancer Sister   . COPD Brother        oldest brother    BP 111/80   Pulse 60   Temp 97.7 F (36.5 C)   Ht 5\' 8"  (1.727 m)   Wt 156 lb (70.8 kg)   BMI 23.72 kg/m      Objective:   Physical Exam  Constitutional: She is oriented to person, place, and time. She appears well-developed and well-nourished.  HENT:  Head: Normocephalic and atraumatic.  Eyes: Pupils are equal, round, and reactive to light. Conjunctivae and EOM are normal.  Neck: Normal range of motion. Neck supple.  Cardiovascular: Normal rate, regular rhythm and intact distal  pulses.   Pulmonary/Chest: Effort normal.  Abdominal: Soft.  Musculoskeletal: She exhibits tenderness (Lower back is tender, more on left, no spasm.  ROM is full including touching toes, SLR negative.  Gait is normal.).  Neurological: She is alert and oriented to person, place, and time. She displays normal reflexes. No cranial nerve deficit. She exhibits normal muscle tone. Coordination normal.  Skin: Skin is warm and dry.  Psychiatric: She has a normal mood and affect. Her behavior is normal. Judgment and thought content normal.    I have reviewed the x-rays and notes from Dr. Buelah Manis.      Assessment & Plan:   Encounter Diagnosis  Name Primary?  . Chronic left-sided low back pain without sciatica Yes   Begin PT, Naprosyn.  Return in two weeks.  Call if any problem.  Precautions discussed.    Electronically Signed Sanjuana Kava, MD 7/12/201812:12 PM

## 2016-11-27 ENCOUNTER — Other Ambulatory Visit: Payer: Self-pay | Admitting: Family Medicine

## 2016-11-27 NOTE — Telephone Encounter (Signed)
Medication called to pharmacy. 

## 2016-11-27 NOTE — Telephone Encounter (Signed)
Okay to refill? 

## 2016-11-27 NOTE — Telephone Encounter (Signed)
Ok to refill??  Last office visit 11/01/2016.  Last refill 09/04/2016, #2 refills.

## 2016-11-28 ENCOUNTER — Telehealth: Payer: Self-pay | Admitting: Orthopedic Surgery

## 2016-11-28 NOTE — Telephone Encounter (Signed)
Pt called and stated we set her up for PT at [facility] in [place] which is near her home but she works in [employer] and would rather have therapy there.  She says she works on [employer] in [place] but near [place].     Arbie Cookey and I did some investigating and there is a [facility] at  [employer] that is near [place].  Would you like to maybe schedule her there?  The phone number for that facility is [phone] in case the patient may need the number.

## 2016-11-28 NOTE — Telephone Encounter (Signed)
A new order was placed with [facility] and the patient was informed.  I gave her the phone number there so she can go ahead and get it scheduled with them.

## 2016-11-28 NOTE — Addendum Note (Signed)
Addended by: Glory Buff on: 11/28/2016 04:52 PM   Modules accepted: Orders

## 2016-12-01 ENCOUNTER — Encounter: Payer: Self-pay | Admitting: Physical Therapy

## 2016-12-01 ENCOUNTER — Ambulatory Visit: Payer: PRIVATE HEALTH INSURANCE | Attending: Orthopedic Surgery | Admitting: Physical Therapy

## 2016-12-01 DIAGNOSIS — M5442 Lumbago with sciatica, left side: Secondary | ICD-10-CM | POA: Insufficient documentation

## 2016-12-01 DIAGNOSIS — M62830 Muscle spasm of back: Secondary | ICD-10-CM | POA: Insufficient documentation

## 2016-12-01 NOTE — Therapy (Signed)
[place] [address] [place], [place], [address] Phone: [phone]   Fax:  [phone]  Physical Therapy Evaluation  Patient Details  Name: Tracey Fox MRN: [id number] Date of Birth: July 13, 1955 Referring Provider: Luna Glasgow  Encounter Date: 12/01/2016      PT End of Session - 12/01/16 0909    Visit Number 1   Date for PT Re-Evaluation 02/01/17   PT Start Time 0841   PT Stop Time 0930   PT Time Calculation (min) 49 min   Activity Tolerance Patient tolerated treatment well;Patient limited by pain   Behavior During Therapy Anxious      Past Medical History:  Diagnosis Date  . Anxiety   . Depression   . GERD (gastroesophageal reflux disease)     Past Surgical History:  Procedure Laterality Date  . ABDOMINAL HYSTERECTOMY    . BREAST SURGERY    . CHOLECYSTECTOMY      There were no vitals filed for this visit.       Subjective Assessment - 12/01/16 0843    Subjective Patient reports that she has been having left low back pain and left buttock for about a month, she is unsure of a cause.  X-rays show some degenerative spondylosis.     Pertinent History has anxiety   Limitations Lifting;Standing;Walking;House hold activities   Patient Stated Goals have less pain   Currently in Pain? Yes   Pain Score 6    Pain Location Back   Pain Orientation Left   Pain Descriptors / Indicators Aching   Pain Type Acute pain   Pain Onset More than a month ago   Pain Frequency Constant   Aggravating Factors  walking, bending, sitting, 8/10, worse as the day goes on   Pain Relieving Factors lie down, fetal position, 3/10   Effect of Pain on Daily Activities limits everything            [facility] PT Assessment - 12/01/16 0001      Assessment   Medical Diagnosis left low back pain with sciatica   Referring Provider [place]   Onset Date/Surgical Date 11/01/16   Prior Therapy none     Precautions   Precautions None     Balance Screen   Has the patient fallen in the past 6 months No   Has the patient had a decrease in activity level because of a fear of falling?  No   Is the patient reluctant to leave their home because of a fear of falling?  No     Home Environment   Additional Comments does her own housework, some gardening     Prior Function   Level of Independence Independent   Vocation Full time employment   Administrator, up and down all day, some lifting some walking   Leisure no exercise     ROM / Strength   AROM / PROM / Strength AROM;Strength     AROM   Overall AROM Comments lumbar ROM was decreased 50% with pain and tightness     Strength   Overall Strength Comments 4+/5 with some discomfort     Flexibility   Soft Tissue Assessment /Muscle Length yes   Hamstrings + SLR on the left at 45 degrees   Piriformis tight and replicated her pain on the left     Palpation   Palpation comment very tight in the lumbar area, she was very tender in the left buttock  Ambulation/Gait   Gait Comments slow, gaurded motions, mild antalgic on the left            Objective measurements completed on examination: See above findings.          [facility] Adult PT Treatment/Exercise - 12/01/16 0001      Modalities   Modalities Electrical Stimulation;Moist Heat     Moist Heat Therapy   Number Minutes Moist Heat 15 Minutes   Moist Heat Location Lumbar Spine     Electrical Stimulation   Electrical Stimulation Location left lumbar/buttock   Electrical Stimulation Action IFC   Electrical Stimulation Parameters supine   Electrical Stimulation Goals Pain                PT Education - 12/01/16 0909    Education provided Yes   Education Details Wms flexion, piriformis stretches   Person(s) Educated Patient   Methods Explanation;Demonstration;Handout   Comprehension Verbalized understanding          PT Short Term Goals - 12/01/16  1014      PT SHORT TERM GOAL #1   Title independent with initial HEP   Time 2   Period Weeks   Status New           PT Long Term Goals - 12/01/16 1015      PT LONG TERM GOAL #1   Title decrease pain 50%   Time 8   Period Weeks   Status New     PT LONG TERM GOAL #2   Title increase lumbar ROM 25%   Time 8   Period Weeks   Status New     PT LONG TERM GOAL #3   Title increase SLR on the left to 70 degrees   Time 8   Period Weeks   Status New     PT LONG TERM GOAL #4   Title understand proper posture and body mechanics   Time 8   Period Weeks   Status New                Plan - 12/01/16 1011    Clinical Impression Statement Patient reports onset of low back pain with left sciatica about a month ago, no known cause.  X-rays show some degenerative spondylosis at two levels.  She has some significant spasms in the lumbar area, positive SLR on the left and painful left piriformis.  Lumbar ROM was decreased 50%, has pain with all activity.   History and Personal Factors relevant to plan of care: anxiety   Clinical Presentation Evolving   Clinical Decision Making Low   Rehab Potential Good   PT Frequency 2x / week   PT Duration 8 weeks   PT Treatment/Interventions ADLs/Self Care Home Management;Moist Heat;Traction;Cryotherapy;Electrical Stimulation;Therapeutic activities;Therapeutic exercise;Balance training;Neuromuscular re-education;Patient/family education;Manual techniques   PT Next Visit Plan start easy movements in gym and could try lumbar stabilization   Consulted and Agree with Plan of Care Patient      Patient will benefit from skilled therapeutic intervention in order to improve the following deficits and impairments:  Decreased activity tolerance, Decreased mobility, Decreased strength, Postural dysfunction, Improper body mechanics, Impaired flexibility, Pain, Decreased range of motion, Difficulty walking  Visit Diagnosis: Acute left-sided low back  pain with left-sided sciatica  Muscle spasm of back     Problem List Patient Active Problem List   Diagnosis Date Noted  . Esophageal dysphagia 11/09/2016  . Infected cyst of skin 12/10/2013  . Back pain 12/10/2013  .  Major depression ([place]) 07/06/2013  . Chest pain 03/12/2013  . Atypical chest pain 02/16/2013  . Family history of MI (myocardial infarction) 02/16/2013  . Wheezing 01/02/2013  . Grief reaction 08/05/2012  . Dysphagia 12/05/2011  . Anxiety 12/05/2011  . GERD (gastroesophageal reflux disease) 12/05/2011  . Pulmonary nodules 12/05/2011  . Dyspnea 12/03/2011  . Abnormal CT scan, chest 12/03/2011    Sumner Boast., PT 12/01/2016, 10:18 AM  [address] [place], [place], [address] Phone: [phone]   Fax:  [phone]  Name: HEAVENLY CHRISTINE MRN: [id number] Date of Birth: 1956/03/22

## 2016-12-04 ENCOUNTER — Ambulatory Visit: Payer: PRIVATE HEALTH INSURANCE | Admitting: Physical Therapy

## 2016-12-07 ENCOUNTER — Encounter: Payer: Self-pay | Admitting: Physical Therapy

## 2016-12-07 ENCOUNTER — Ambulatory Visit: Payer: PRIVATE HEALTH INSURANCE | Admitting: Physical Therapy

## 2016-12-07 DIAGNOSIS — M62830 Muscle spasm of back: Secondary | ICD-10-CM

## 2016-12-07 DIAGNOSIS — M5442 Lumbago with sciatica, left side: Secondary | ICD-10-CM | POA: Diagnosis not present

## 2016-12-07 NOTE — Therapy (Signed)
[address] [place], [place], [address] Phone: [phone]   Fax:  [phone]  Physical Therapy Treatment  Patient Details  Name: Tracey Fox MRN: [id number] Date of Birth: Mar 13, 1956 Referring Provider: Luna Glasgow  Encounter Date: 12/07/2016      PT End of Session - 12/07/16 1643    Visit Number 2   Date for PT Re-Evaluation 02/01/17   PT Start Time 1610   PT Stop Time 1700   PT Time Calculation (min) 50 min      Past Medical History:  Diagnosis Date  . Anxiety   . Depression   . GERD (gastroesophageal reflux disease)     Past Surgical History:  Procedure Laterality Date  . ABDOMINAL HYSTERECTOMY    . BREAST SURGERY    . CHOLECYSTECTOMY      There were no vitals filed for this visit.      Subjective Assessment - 12/07/16 1611    Subjective about the same, doing ex at home with many some relief   Currently in Pain? Yes   Pain Score 5    Pain Location Back                         [facility] Adult PT Treatment/Exercise - 12/07/16 0001      Exercises   Exercises Lumbar     Lumbar Exercises: Aerobic   Stationary Bike Nustep L 5 5 min     Lumbar Exercises: Standing   Other Standing Lumbar Exercises red tband retraction and shld ext 10 each   Other Standing Lumbar Exercises hip ext and abd red tband 10 times each     Lumbar Exercises: Seated   Other Seated Lumbar Exercises sit fit pelvic ROM      Lumbar Exercises: Supine   Other Supine Lumbar Exercises bridge with ball, KTC and obl 15 times each     Modalities   Modalities Electrical Stimulation;Moist Heat     Moist Heat Therapy   Number Minutes Moist Heat 15 Minutes   Moist Heat Location Lumbar Spine     Electrical Stimulation   Electrical Stimulation Location left lumbar/buttock   Electrical Stimulation Action IFC   Electrical Stimulation Parameters supine   Electrical Stimulation Goals Pain     Manual  Therapy   Manual Therapy Passive ROM   Passive ROM LE and LB                PT Education - 12/07/16 1620    Education provided Yes   Education Details sitting positions and towel roll   Person(s) Educated Patient   Methods Explanation;Demonstration   Comprehension Verbalized understanding;Returned demonstration          PT Short Term Goals - 12/01/16 1014      PT SHORT TERM GOAL #1   Title independent with initial HEP   Time 2   Period Weeks   Status New           PT Long Term Goals - 12/01/16 1015      PT LONG TERM GOAL #1   Title decrease pain 50%   Time 8   Period Weeks   Status New     PT LONG TERM GOAL #2   Title increase lumbar ROM 25%   Time 8   Period Weeks   Status New     PT LONG TERM GOAL #3   Title increase SLR on  the left to 70 degrees   Time 8   Period Weeks   Status New     PT LONG TERM GOAL #4   Title understand proper posture and body mechanics   Time 8   Period Weeks   Status New               Plan - 12/07/16 1644    Clinical Impression Statement cuing with ex for posture and control of mvmt as pt is very guarded. with cuing tol interventions well.,   PT Next Visit Plan assess and progress as tolerated      Patient will benefit from skilled therapeutic intervention in order to improve the following deficits and impairments:  Decreased activity tolerance, Decreased mobility, Decreased strength, Postural dysfunction, Improper body mechanics, Impaired flexibility, Pain, Decreased range of motion, Difficulty walking  Visit Diagnosis: Acute left-sided low back pain with left-sided sciatica  Muscle spasm of back     Problem List Patient Active Problem List   Diagnosis Date Noted  . Esophageal dysphagia 11/09/2016  . Infected cyst of skin 12/10/2013  . Back pain 12/10/2013  . Major depression ([place]) 07/06/2013  . Chest pain 03/12/2013  . Atypical chest pain 02/16/2013  . Family history of MI (myocardial  infarction) 02/16/2013  . Wheezing 01/02/2013  . Grief reaction 08/05/2012  . Dysphagia 12/05/2011  . Anxiety 12/05/2011  . GERD (gastroesophageal reflux disease) 12/05/2011  . Pulmonary nodules 12/05/2011  . Dyspnea 12/03/2011  . Abnormal CT scan, chest 12/03/2011    Rafiel Mecca,ANGIE PTA 12/07/2016, 4:46 PM  [facility] [address] [place], [place], [address] Phone: [phone]   Fax:  [phone]  Name: Tracey Fox MRN: [id number] Date of Birth: December 03, 1955

## 2016-12-14 ENCOUNTER — Encounter: Payer: Self-pay | Admitting: Orthopaedic Surgery

## 2016-12-14 ENCOUNTER — Ambulatory Visit: Payer: No Typology Code available for payment source | Admitting: Physical Therapy

## 2016-12-14 ENCOUNTER — Ambulatory Visit (INDEPENDENT_AMBULATORY_CARE_PROVIDER_SITE_OTHER): Payer: PRIVATE HEALTH INSURANCE | Admitting: Orthopaedic Surgery

## 2016-12-14 VITALS — BP 110/67 | HR 59 | Temp 97.3°F | Ht 68.0 in | Wt 159.0 lb

## 2016-12-14 DIAGNOSIS — G8929 Other chronic pain: Secondary | ICD-10-CM

## 2016-12-14 DIAGNOSIS — M545 Low back pain, unspecified: Secondary | ICD-10-CM

## 2016-12-14 NOTE — Progress Notes (Signed)
Patient TD:[id number] Tracey Fox, female DOB:13-Jun-1955, 61 y.o. PXT:[id number]  Chief Complaint  Patient presents with  . Follow-up    low back pain    HPI  Tracey Fox is a 61 y.o. female who has lower back pain with left sided with no sciatica.  She has been to PT.  She did well in PT the first visit but the second visit made her worse.  She could not get comfortable after it.  I told her to talk to the therapist prior to the next treatment.  She admits she did not tell the therapist she was having pain during the exercise and stretching part of the treatment.  She liked the TENS unit and I have given her a Rx for one.  She has no new trauma.  She has no weakness. HPI  Body mass index is 24.18 kg/m.  ROS  Review of Systems  HENT: Negative for congestion.   Respiratory: Negative for cough and shortness of breath.   Cardiovascular: Negative for chest pain and leg swelling.  Endocrine: Positive for cold intolerance.  Musculoskeletal: Positive for arthralgias and back pain.  Allergic/Immunologic: Positive for environmental allergies.  Psychiatric/Behavioral: The patient is nervous/anxious.     Past Medical History:  Diagnosis Date  . Anxiety   . Depression   . GERD (gastroesophageal reflux disease)     Past Surgical History:  Procedure Laterality Date  . ABDOMINAL HYSTERECTOMY    . BREAST SURGERY    . CHOLECYSTECTOMY      Family History  Problem Relation Age of Onset  . Heart disease Father   . Breast cancer Sister   . Cancer Sister   . COPD Brother        oldest brother    Social History Social History  Substance Use Topics  . Smoking status: Never Smoker  . Smokeless tobacco: Never Used  . Alcohol use Yes     Comment: occ    No Known Allergies  Current Outpatient Prescriptions  Medication Sig Dispense Refill  . ALPRAZolam (XANAX) 1 MG tablet TAKE 1 TABLET BY MOUTH TWICE DAILY AS NEEDED FOR ANXIETY 60 tablet 2  . ciprofloxacin (CIPRO) 500 MG tablet  Take 1 tablet (500 mg total) by mouth 2 (two) times daily. 14 tablet 0  . HYDROcodone-acetaminophen (NORCO/VICODIN) 5-325 MG tablet Take one tab po q 4-6 hrs prn pain 12 tablet 0  . methocarbamol (ROBAXIN) 500 MG tablet Take 1 tablet (500 mg total) by mouth 3 (three) times daily. 21 tablet 0  . metroNIDAZOLE (FLAGYL) 500 MG tablet Take 1 tablet (500 mg total) by mouth 3 (three) times daily. 21 tablet 0  . naproxen (NAPROSYN) 500 MG tablet Take 1 tablet (500 mg total) by mouth 2 (two) times daily with a meal. 60 tablet 5  . pantoprazole (PROTONIX) 40 MG tablet Take 1 tablet (40 mg total) by mouth daily. 30 tablet 3  . promethazine (PHENERGAN) 25 MG tablet Take 1 tablet (25 mg total) by mouth every 6 (six) hours as needed for nausea or vomiting. 8 tablet 0   No current facility-administered medications for this visit.      Physical Exam  Blood pressure 110/67, pulse (!) 59, temperature (!) 97.3 F (36.3 C), height 5\' 8"  (1.727 m), weight 159 lb (72.1 kg).  Constitutional: overall normal hygiene, normal nutrition, well developed, normal grooming, normal body habitus. Assistive device:none  Musculoskeletal: gait and station Limp none, muscle tone and strength are normal, no tremors or atrophy is  present.  .  Neurological: coordination overall normal.  Deep tendon reflex/nerve stretch intact.  Sensation normal.  Cranial nerves II-XII intact.   Skin:   Normal overall no scars, lesions, ulcers or rashes. No psoriasis.  Psychiatric: Alert and oriented x 3.  Recent memory intact, remote memory unclear.  Normal mood and affect. Well groomed.  Good eye contact.  Cardiovascular: overall no swelling, no varicosities, no edema bilaterally, normal temperatures of the legs and arms, no clubbing, cyanosis and good capillary refill.  Lymphatic: palpation is normal.  Spine/Pelvis examination:  Inspection:  Overall, sacoiliac joint benign and hips nontender; without crepitus or defects.   Thoracic  spine inspection: Alignment normal without kyphosis present   Lumbar spine inspection:  Alignment  with normal lumbar lordosis, without scoliosis apparent.   Thoracic spine palpation:  without tenderness of spinal processes   Lumbar spine palpation: with tenderness of lumbar area; with tightness of lumbar muscles    Range of Motion:   Lumbar flexion, forward flexion is 25 with pain or tenderness    Lumbar extension is 5 with pain or tenderness   Left lateral bend is Normal  with pain or tenderness   Right lateral bend is Normal with pain or tenderness   Straight leg raising is Normal   Strength & tone: Normal   Stability overall normal stability     The patient has been educated about the nature of the problem(s) and counseled on treatment options.  The patient appeared to understand what I have discussed and is in agreement with it.  Encounter Diagnosis  Name Primary?  . Chronic left-sided low back pain without sciatica Yes    PLAN Call if any problems.  Precautions discussed.  Continue current medications.   Return to clinic 2 weeks   Talk to therapist.  Tell therapist if treatment hurts or bothers her.  TENs unit Rx given.  Electronically Signed Sanjuana Kava, MD 8/2/201810:24 AM

## 2016-12-19 ENCOUNTER — Ambulatory Visit: Payer: PRIVATE HEALTH INSURANCE | Admitting: Orthopaedic Surgery

## 2016-12-19 ENCOUNTER — Ambulatory Visit: Payer: No Typology Code available for payment source | Admitting: Physical Therapy

## 2016-12-21 ENCOUNTER — Ambulatory Visit: Payer: No Typology Code available for payment source | Attending: Orthopedic Surgery | Admitting: Physical Therapy

## 2016-12-21 ENCOUNTER — Encounter: Payer: Self-pay | Admitting: Physical Therapy

## 2016-12-21 DIAGNOSIS — M62830 Muscle spasm of back: Secondary | ICD-10-CM | POA: Diagnosis present

## 2016-12-21 DIAGNOSIS — M5442 Lumbago with sciatica, left side: Secondary | ICD-10-CM | POA: Diagnosis present

## 2016-12-21 NOTE — Therapy (Signed)
[facility] [place], [place], [address] Phone: [phone]   Fax:  [phone]  Physical Therapy Treatment  Patient Details  Name: Tracey Fox MRN: [id number] Date of Birth: 06-Jun-1955 Referring Provider: Luna Fox  Encounter Date: 12/21/2016      PT End of Session - 12/21/16 1641    Visit Number 3   Date for PT Re-Evaluation 02/01/17   PT Start Time 1615   PT Stop Time 1710   PT Time Calculation (min) 55 min      Past Medical History:  Diagnosis Date  . Anxiety   . Depression   . GERD (gastroesophageal reflux disease)     Past Surgical History:  Procedure Laterality Date  . ABDOMINAL HYSTERECTOMY    . BREAST SURGERY    . CHOLECYSTECTOMY      There were no vitals filed for this visit.      Subjective Assessment - 12/21/16 1615    Subjective " dont work me too hard today I was so sore after last time"   Currently in Pain? Yes   Pain Score 6    Pain Location Back                         [facility] Adult PT Treatment/Exercise - 12/21/16 0001      Self-Care   Self-Care --  TENS use, settings,electrode placement and safety     Lumbar Exercises: Aerobic   Stationary Bike Nustep L 5 5 min     Lumbar Exercises: Supine   Ab Set 10 reps   Clam 15 reps  red tband   Bridge 10 reps  with ball squeeze   Other Supine Lumbar Exercises bridge with ball, KTC and obl 15 times each     Moist Heat Therapy   Number Minutes Moist Heat 15 Minutes   Moist Heat Location Lumbar Spine     Electrical Stimulation   Electrical Stimulation Location left lumbar/buttock   Electrical Stimulation Action IFC   Electrical Stimulation Parameters supine   Electrical Stimulation Goals Pain     Manual Therapy   Manual Therapy Passive ROM   Manual therapy comments RT HS very tight and painful   Passive ROM LE and LB                PT Education - 12/21/16 1641    Education  provided Yes   Education Details TENS   Person(s) Educated Patient   Methods Explanation   Comprehension Verbalized understanding          PT Short Term Goals - 12/21/16 1641      PT SHORT TERM GOAL #1   Title independent with initial HEP   Status Achieved           PT Long Term Goals - 12/21/16 1641      PT LONG TERM GOAL #1   Title decrease pain 50%   Status On-going     PT LONG TERM GOAL #2   Title increase lumbar ROM 25%   Baseline no change from eval, pt fearful of pain so guarded with mvmt   Status On-going     PT LONG TERM GOAL #3   Title increase SLR on the left to 70 degrees   Status On-going     PT LONG TERM GOAL #4   Title understand proper posture and body mechanics   Status Partially Met  Plan - 12/21/16 1642    Clinical Impression Statement STG met, progressing with LTG. Pt purchased TENS so educ on use today. Pt very guarded with mvmt fearful of too much ex d/t pain last session.   PT Treatment/Interventions ADLs/Self Care Home Management;Moist Heat;Traction;Cryotherapy;Electrical Stimulation;Therapeutic activities;Therapeutic exercise;Balance training;Neuromuscular re-education;Patient/family education;Manual techniques   PT Next Visit Plan slowly progress ex /strength and flexibility      Patient will benefit from skilled therapeutic intervention in order to improve the following deficits and impairments:  Decreased activity tolerance, Decreased mobility, Decreased strength, Postural dysfunction, Improper body mechanics, Impaired flexibility, Pain, Decreased range of motion, Difficulty walking  Visit Diagnosis: Acute left-sided low back pain with left-sided sciatica  Muscle spasm of back     Problem List Patient Active Problem List   Diagnosis Date Noted  . Esophageal dysphagia 11/09/2016  . Infected cyst of skin 12/10/2013  . Back pain 12/10/2013  . Major depression ([place]) 07/06/2013  . Chest pain 03/12/2013  .  Atypical chest pain 02/16/2013  . Family history of MI (myocardial infarction) 02/16/2013  . Wheezing 01/02/2013  . Grief reaction 08/05/2012  . Dysphagia 12/05/2011  . Anxiety 12/05/2011  . GERD (gastroesophageal reflux disease) 12/05/2011  . Pulmonary nodules 12/05/2011  . Dyspnea 12/03/2011  . Abnormal CT scan, chest 12/03/2011    Tracey Fox,Tracey Fox  PTA 12/21/2016, 4:44 PM  [address] [place], [place], [address] Phone: [phone]   Fax:  [phone]  Name: Tracey Fox MRN: [id number] Date of Birth: [birth year]

## 2016-12-27 ENCOUNTER — Ambulatory Visit: Payer: PRIVATE HEALTH INSURANCE | Admitting: Orthopaedic Surgery

## 2016-12-28 ENCOUNTER — Ambulatory Visit (INDEPENDENT_AMBULATORY_CARE_PROVIDER_SITE_OTHER): Payer: PRIVATE HEALTH INSURANCE | Admitting: Orthopaedic Surgery

## 2016-12-28 ENCOUNTER — Encounter: Payer: Self-pay | Admitting: Orthopaedic Surgery

## 2016-12-28 VITALS — BP 112/71 | HR 83 | Temp 97.1°F | Ht 68.0 in | Wt 159.0 lb

## 2016-12-28 DIAGNOSIS — G8929 Other chronic pain: Secondary | ICD-10-CM

## 2016-12-28 DIAGNOSIS — M545 Low back pain, unspecified: Secondary | ICD-10-CM

## 2016-12-28 NOTE — Progress Notes (Signed)
Patient Tracey Fox, female DOB:[birth year], 61 y.o. TSV:[id number]  Chief Complaint  Patient presents with  . Follow-up    LBP    HPI  Tracey Fox is a 61 y.o. female who has lower back pain with no paresthesias.  She has been to PT and is a little better.  She still has pain and good and bad days.  She is moving better and having less pain when she has pain.  She has no new trauma, no paresthesias, no weakness. HPI  Body mass index is 24.18 kg/m.  ROS  Review of Systems  HENT: Negative for congestion.   Respiratory: Negative for cough and shortness of breath.   Cardiovascular: Negative for chest pain and leg swelling.  Endocrine: Positive for cold intolerance.  Musculoskeletal: Positive for arthralgias and back pain.  Allergic/Immunologic: Positive for environmental allergies.  Psychiatric/Behavioral: The patient is nervous/anxious.     Past Medical History:  Diagnosis Date  . Anxiety   . Depression   . GERD (gastroesophageal reflux disease)     Past Surgical History:  Procedure Laterality Date  . ABDOMINAL HYSTERECTOMY    . BREAST SURGERY    . CHOLECYSTECTOMY      Family History  Problem Relation Age of Onset  . Heart disease Father   . Breast cancer Sister   . Cancer Sister   . COPD Brother        oldest brother    Social History Social History  Substance Use Topics  . Smoking status: Never Smoker  . Smokeless tobacco: Never Used  . Alcohol use Yes     Comment: occ    No Known Allergies  Current Outpatient Prescriptions  Medication Sig Dispense Refill  . ALPRAZolam (XANAX) 1 MG tablet TAKE 1 TABLET BY MOUTH TWICE DAILY AS NEEDED FOR ANXIETY 60 tablet 2  . ciprofloxacin (CIPRO) 500 MG tablet Take 1 tablet (500 mg total) by mouth 2 (two) times daily. 14 tablet 0  . HYDROcodone-acetaminophen (NORCO/VICODIN) 5-325 MG tablet Take one tab po q 4-6 hrs prn pain 12 tablet 0  . methocarbamol (ROBAXIN) 500 MG tablet Take 1 tablet (500 mg  total) by mouth 3 (three) times daily. 21 tablet 0  . metroNIDAZOLE (FLAGYL) 500 MG tablet Take 1 tablet (500 mg total) by mouth 3 (three) times daily. 21 tablet 0  . naproxen (NAPROSYN) 500 MG tablet Take 1 tablet (500 mg total) by mouth 2 (two) times daily with a meal. 60 tablet 5  . pantoprazole (PROTONIX) 40 MG tablet Take 1 tablet (40 mg total) by mouth daily. 30 tablet 3  . promethazine (PHENERGAN) 25 MG tablet Take 1 tablet (25 mg total) by mouth every 6 (six) hours as needed for nausea or vomiting. 8 tablet 0   No current facility-administered medications for this visit.      Physical Exam  Blood pressure 112/71, pulse 83, temperature (!) 97.1 F (36.2 C), height 5\' 8"  (1.727 m), weight 159 lb (72.1 kg).  Constitutional: overall normal hygiene, normal nutrition, well developed, normal grooming, normal body habitus. Assistive device:none  Musculoskeletal: gait and station Limp none, muscle tone and strength are normal, no tremors or atrophy is present.  .  Neurological: coordination overall normal.  Deep tendon reflex/nerve stretch intact.  Sensation normal.  Cranial nerves II-XII intact.   Skin:   Normal overall no scars, lesions, ulcers or rashes. No psoriasis.  Psychiatric: Alert and oriented x 3.  Recent memory intact, remote memory unclear.  Normal mood  and affect. Well groomed.  Good eye contact.  Cardiovascular: overall no swelling, no varicosities, no edema bilaterally, normal temperatures of the legs and arms, no clubbing, cyanosis and good capillary refill.  Lymphatic: palpation is normal.  Her lower back is much improved. She can touch her toes and has full ROM with no pain today.  She has no spasm.  Gait is normal.  NV intact. SLR negative.  Muscle tone and strength are normal.  The patient has been educated about the nature of the problem(s) and counseled on treatment options.  The patient appeared to understand what I have discussed and is in agreement with  it.  Encounter Diagnosis  Name Primary?  . Chronic left-sided low back pain without sciatica Yes    PLAN Call if any problems.  Precautions discussed.  Continue current medications.   Return to clinic 1 month   Continue PT.  Electronically Signed Sanjuana Kava, MD 8/16/20182:18 PM

## 2016-12-29 ENCOUNTER — Ambulatory Visit: Payer: No Typology Code available for payment source | Admitting: Physical Therapy

## 2017-01-05 ENCOUNTER — Ambulatory Visit: Payer: No Typology Code available for payment source | Admitting: Physical Therapy

## 2017-01-05 ENCOUNTER — Encounter: Payer: Self-pay | Admitting: Physical Therapy

## 2017-01-05 DIAGNOSIS — M5442 Lumbago with sciatica, left side: Secondary | ICD-10-CM

## 2017-01-05 DIAGNOSIS — M62830 Muscle spasm of back: Secondary | ICD-10-CM

## 2017-01-05 NOTE — Therapy (Signed)
[place] [address] [place], [place], [address] Phone: [phone]   Fax:  [phone]  Physical Therapy Treatment  Patient Details  Name: Tracey Fox MRN: [id number] Date of Birth: [birth year] Referring Provider: Luna Glasgow  Encounter Date: 01/05/2017      PT End of Session - 01/05/17 0859    Visit Number 4   Date for PT Re-Evaluation 02/01/17   PT Start Time 0811   PT Stop Time 0910   PT Time Calculation (min) 59 min   Activity Tolerance Patient tolerated treatment well;Patient limited by pain   Behavior During Therapy [facility] for tasks assessed/performed      Past Medical History:  Diagnosis Date   Anxiety    Depression    GERD (gastroesophageal reflux disease)     Past Surgical History:  Procedure Laterality Date   ABDOMINAL HYSTERECTOMY     BREAST SURGERY     CHOLECYSTECTOMY      There were no vitals filed for this visit.      Subjective Assessment - 01/05/17 0818    Subjective Has good days and bad days                         [facility] Adult PT Treatment/Exercise - 01/05/17 0001      Bed Mobility   Bed Mobility Sitting - Scoot to Edge of Bed     Lumbar Exercises: Aerobic   Stationary Bike Nustep L 5 5 min   UBE (Upper Arm Bike) --     Lumbar Exercises: Standing   Other Standing Lumbar Exercises red tband retraction and shld ext 10 each     Lumbar Exercises: Seated   Sit to Stand Limitations seated w/ L/S roll, Safe sitting@ computer     Lumbar Exercises: Supine   Ab Set 10 reps;5 seconds   Other Supine Lumbar Exercises trunk rotation 10x5"     Modalities   Modalities Electrical Stimulation;Moist Heat     Moist Heat Therapy   Number Minutes Moist Heat 15 Minutes   Moist Heat Location Lumbar Spine     Electrical Stimulation   Electrical Stimulation Location left lumbar/buttock   Electrical Stimulation Action IFC   Electrical Stimulation Parameters  Supine   Electrical Stimulation Goals Pain     Manual Therapy   Manual Therapy Passive ROM   Manual therapy comments RT HS very tight and painful   Passive ROM LE and LB                  PT Short Term Goals - 12/21/16 1641      PT SHORT TERM GOAL #1   Title independent with initial HEP   Status Achieved           PT Long Term Goals - 01/05/17 0817      PT LONG TERM GOAL #1   Title decrease pain 50%   Status New               Plan - 01/05/17 0859    Clinical Impression Statement Requires mod cues for post pelvic tilt.  More so in standing.  Improved sitting tolerance with use of L/S roll.  Hadout given for safe sitting @ computer for postural cues.      Patient will benefit from skilled therapeutic intervention in order to improve the following deficits and impairments:     Visit Diagnosis: Acute left-sided low back pain with  left-sided sciatica  Muscle spasm of back     Problem List Patient Active Problem List   Diagnosis Date Noted   Esophageal dysphagia 11/09/2016   Infected cyst of skin 12/10/2013   Back pain 12/10/2013   Major depression ([place]) 07/06/2013   Chest pain 03/12/2013   Atypical chest pain 02/16/2013   Family history of MI (myocardial infarction) 02/16/2013   Wheezing 01/02/2013   Grief reaction 08/05/2012   Dysphagia 12/05/2011   Anxiety 12/05/2011   GERD (gastroesophageal reflux disease) 12/05/2011   Pulmonary nodules 12/05/2011   Dyspnea 12/03/2011   Abnormal CT scan, chest 12/03/2011    Olean Ree, PTA 01/05/2017, 9:03 AM  [address] [place], [place], [address] Phone: [phone]   Fax:  [phone]  Name: Tracey Fox MRN: [id number] Date of Birth: [birth year]

## 2017-01-09 ENCOUNTER — Encounter: Payer: Self-pay | Admitting: Physical Therapy

## 2017-01-09 ENCOUNTER — Ambulatory Visit: Payer: No Typology Code available for payment source | Admitting: Physical Therapy

## 2017-01-09 DIAGNOSIS — M62830 Muscle spasm of back: Secondary | ICD-10-CM

## 2017-01-09 DIAGNOSIS — M5442 Lumbago with sciatica, left side: Secondary | ICD-10-CM

## 2017-01-09 NOTE — Therapy (Signed)
[facility] [address] [place], [place], [address] Phone: [phone]   Fax:  [phone]  Physical Therapy Treatment  Patient Details  Name: Tracey Fox MRN: [id number] Date of Birth: 11/02/1955 Referring Provider: [place]  Encounter Date: 01/09/2017      PT End of Session - 01/09/17 0837    Visit Number 5   Date for PT Re-Evaluation 02/01/17   PT Start Time 0800   PT Stop Time 0855   PT Time Calculation (min) 55 min      Past Medical History:  Diagnosis Date  . Anxiety   . Depression   . GERD (gastroesophageal reflux disease)     Past Surgical History:  Procedure Laterality Date  . ABDOMINAL HYSTERECTOMY    . BREAST SURGERY    . CHOLECYSTECTOMY      There were no vitals filed for this visit.      Subjective Assessment - 01/09/17 0809    Subjective good and bad days, seems to vary with weather. using good sittig posture and doing HEP. Pt vague if better with PT   Currently in Pain? No/denies                         [facility] Adult PT Treatment/Exercise - 01/09/17 0001      Lumbar Exercises: Stretches   Active Hamstring Stretch 2 reps;30 seconds   Piriformis Stretch 2 reps;30 seconds     Lumbar Exercises: Aerobic   Stationary Bike Nustep L 5 6 min     Lumbar Exercises: Machines for Strengthening   Cybex Lumbar Extension black tband 2 sets 15   Other Lumbar Machine Exercise 10# obl pulleys 15 times each   Other Lumbar Machine Exercise row and lat 25# 2 sets 10     Lumbar Exercises: Standing   Other Standing Lumbar Exercises 5# dead lift 2 sets 10     Lumbar Exercises: Supine   Other Supine Lumbar Exercises bridge with ball and trunk rot 15 each     Moist Heat Therapy   Number Minutes Moist Heat 15 Minutes   Moist Heat Location Lumbar Spine     Electrical Stimulation   Electrical Stimulation Location lumbar   Electrical Stimulation Action IFC   Electrical  Stimulation Parameters supine   Electrical Stimulation Goals Pain     Manual Therapy   Manual Therapy Passive ROM   Passive ROM LE and LB                PT Education - 01/09/17 0827    Education provided Yes   Education Details LE stretches   Person(s) Educated Patient   Methods Explanation;Demonstration   Comprehension Verbalized understanding;Returned demonstration          PT Short Term Goals - 01/09/17 0830      PT SHORT TERM GOAL #1   Title independent with initial HEP   Status Achieved           PT Long Term Goals - 01/09/17 0830      PT LONG TERM GOAL #1   Title decrease pain 50%   Status On-going     PT LONG TERM GOAL #2   Title increase lumbar ROM 25%   Baseline WFLS   Status Achieved     PT LONG TERM GOAL #3   Title increase SLR on the left to 70 degrees   Baseline RT 85       LEft 80   Status Achieved     PT LONG TERM GOAL #4   Title understand proper posture and body mechanics   Status Partially Met               Plan - 01/09/17 0838    Clinical Impression Statement ROM for lumb and HS goal is met- stressed importance of stretching at home and discussed and okayed return to gentle yoga. progressing with other goals. cuing needed with ther ex for core stab.   PT Next Visit Plan core ex and continued postural ed      Patient will benefit from skilled therapeutic intervention in order to improve the following deficits and impairments:  Decreased activity tolerance, Decreased mobility, Decreased strength, Postural dysfunction, Improper body mechanics, Impaired flexibility, Pain, Decreased range of motion, Difficulty walking  Visit Diagnosis: Acute left-sided low back pain with left-sided sciatica  Muscle spasm of back     Problem List Patient Active Problem List   Diagnosis Date Noted  . Esophageal dysphagia 11/09/2016  . Infected cyst of skin 12/10/2013  . Back pain 12/10/2013  . Major depression ([place]) 07/06/2013  .  Chest pain 03/12/2013  . Atypical chest pain 02/16/2013  . Family history of MI (myocardial infarction) 02/16/2013  . Wheezing 01/02/2013  . Grief reaction 08/05/2012  . Dysphagia 12/05/2011  . Anxiety 12/05/2011  . GERD (gastroesophageal reflux disease) 12/05/2011  . Pulmonary nodules 12/05/2011  . Dyspnea 12/03/2011  . Abnormal CT scan, chest 12/03/2011    Deray Dawes,ANGIE PTA 01/09/2017, 8:40 AM  [address] [place], [place], [address] Phone: [phone]   Fax:  [phone]  Name: Tracey Fox MRN: [id number] Date of Birth: 1956-03-18

## 2017-01-16 ENCOUNTER — Encounter: Payer: Self-pay | Admitting: Physical Therapy

## 2017-01-16 ENCOUNTER — Ambulatory Visit: Payer: No Typology Code available for payment source | Attending: Orthopedic Surgery | Admitting: Physical Therapy

## 2017-01-16 DIAGNOSIS — M62830 Muscle spasm of back: Secondary | ICD-10-CM | POA: Insufficient documentation

## 2017-01-16 DIAGNOSIS — M5442 Lumbago with sciatica, left side: Secondary | ICD-10-CM | POA: Insufficient documentation

## 2017-01-16 NOTE — Therapy (Signed)
[address] [place], [place], [address] Phone: [phone]   Fax:  [phone]  Physical Therapy Treatment  Patient Details  Name: Tracey Fox MRN: [id number] Date of Birth: 07/22/1955 Referring Provider: Luna Glasgow  Encounter Date: 01/16/2017      PT End of Session - 01/16/17 0829    Visit Number 6   Date for PT Re-Evaluation 02/01/17   PT Start Time 0809   PT Stop Time 1829   PT Time Calculation (min) 46 min      Past Medical History:  Diagnosis Date  . Anxiety   . Depression   . GERD (gastroesophageal reflux disease)     Past Surgical History:  Procedure Laterality Date  . ABDOMINAL HYSTERECTOMY    . BREAST SURGERY    . CHOLECYSTECTOMY      There were no vitals filed for this visit.      Subjective Assessment - 01/16/17 0810    Subjective back is better, stretches are helping   Currently in Pain? No/denies                         [facility] Adult PT Treatment/Exercise - 01/16/17 0001      Self-Care   Self-Care Other Self-Care Comments  good standing and sitting posture     Lumbar Exercises: Aerobic   Stationary Bike Nustep L 5 6 min     Lumbar Exercises: Machines for Strengthening   Cybex Lumbar Extension black tband 2 sets 15   Other Lumbar Machine Exercise 10# obl pulleys 15 times each   Other Lumbar Machine Exercise row and lat 25# 2 sets 15     Lumbar Exercises: Standing   Other Standing Lumbar Exercises 5# dead lift 2 sets 10   Other Standing Lumbar Exercises red tband hip ext and abd 10 times BIL     Moist Heat Therapy   Number Minutes Moist Heat 15 Minutes   Moist Heat Location Lumbar Spine     Electrical Stimulation   Electrical Stimulation Location lumbar   Electrical Stimulation Action IFC   Electrical Stimulation Parameters supine   Electrical Stimulation Goals Pain                  PT Short Term Goals - 01/09/17 0830      PT  SHORT TERM GOAL #1   Title independent with initial HEP   Status Achieved           PT Long Term Goals - 01/09/17 0830      PT LONG TERM GOAL #1   Title decrease pain 50%   Status On-going     PT LONG TERM GOAL #2   Title increase lumbar ROM 25%   Baseline WFLS   Status Achieved     PT LONG TERM GOAL #3   Title increase SLR on the left to 70 degrees   Baseline RT 85     LEft 80   Status Achieved     PT LONG TERM GOAL #4   Title understand proper posture and body mechanics   Status Partially Met               Plan - 01/16/17 0830    Clinical Impression Statement pt verb stretches are helping at home, educated today a best sitting posture and foot prop with standing- pt VU   PT Treatment/Interventions ADLs/Self Care Home Management;Moist Heat;Traction;Cryotherapy;Electrical Stimulation;Therapeutic activities;Therapeutic exercise;Balance  training;Neuromuscular re-education;Patient/family education;Manual techniques   PT Next Visit Plan core ex       Patient will benefit from skilled therapeutic intervention in order to improve the following deficits and impairments:  Decreased activity tolerance, Decreased mobility, Decreased strength, Postural dysfunction, Improper body mechanics, Impaired flexibility, Pain, Decreased range of motion, Difficulty walking  Visit Diagnosis: Acute left-sided low back pain with left-sided sciatica  Muscle spasm of back     Problem List Patient Active Problem List   Diagnosis Date Noted  . Esophageal dysphagia 11/09/2016  . Infected cyst of skin 12/10/2013  . Back pain 12/10/2013  . Major depression ([place]) 07/06/2013  . Chest pain 03/12/2013  . Atypical chest pain 02/16/2013  . Family history of MI (myocardial infarction) 02/16/2013  . Wheezing 01/02/2013  . Grief reaction 08/05/2012  . Dysphagia 12/05/2011  . Anxiety 12/05/2011  . GERD (gastroesophageal reflux disease) 12/05/2011  . Pulmonary nodules 12/05/2011  .  Dyspnea 12/03/2011  . Abnormal CT scan, chest 12/03/2011    PAYSEUR,ANGIE PTA 01/16/2017, 8:31 AM  [address] [place], [place], [address] Phone: [phone]   Fax:  [phone]  Name: NIKOLINA SIMERSON MRN: [id number] Date of Birth: 01/23/1956

## 2017-01-19 ENCOUNTER — Ambulatory Visit: Payer: No Typology Code available for payment source | Admitting: Physical Therapy

## 2017-01-19 ENCOUNTER — Encounter: Payer: Self-pay | Admitting: Physical Therapy

## 2017-01-19 DIAGNOSIS — M62830 Muscle spasm of back: Secondary | ICD-10-CM

## 2017-01-19 DIAGNOSIS — M5442 Lumbago with sciatica, left side: Secondary | ICD-10-CM | POA: Diagnosis not present

## 2017-01-19 NOTE — Therapy (Signed)
[place] [address] [place], [place], [address] Phone: [phone]   Fax:  [phone]  Physical Therapy Treatment  Patient Details  Name: Tracey Fox MRN: [id number] Date of Birth: [birth year] Referring Provider: Luna Glasgow  Encounter Date: 01/19/2017      PT End of Session - 01/19/17 0807    Visit Number 7   Date for PT Re-Evaluation 02/01/17   PT Start Time 0800   PT Stop Time 7903   PT Time Calculation (min) 55 min      Past Medical History:  Diagnosis Date  . Anxiety   . Depression   . GERD (gastroesophageal reflux disease)     Past Surgical History:  Procedure Laterality Date  . ABDOMINAL HYSTERECTOMY    . BREAST SURGERY    . CHOLECYSTECTOMY      There were no vitals filed for this visit.      Subjective Assessment - 01/19/17 0802    Subjective goog days and bad days but overall 50% better   Currently in Pain? Yes   Pain Score 2    Pain Location Back   Pain Descriptors / Indicators Aching                         [facility] Adult PT Treatment/Exercise - 01/19/17 0001      Lumbar Exercises: Aerobic   Stationary Bike Nustep L 5 6 min     Lumbar Exercises: Machines for Strengthening   Cybex Lumbar Extension 25# 2 sets 10   Other Lumbar Machine Exercise 10# obl pulleys 15 times each  scap stab 5# pulleys 3 way 15 times each     Lumbar Exercises: Standing   Other Standing Lumbar Exercises 5# dead lift 2 sets 10     Lumbar Exercises: Supine   Clam 15 reps   Clam Limitations with bridge   Straight Leg Raise 15 reps  with abd and red tband   Other Supine Lumbar Exercises bridge with ball and trunk rot 15 each   Other Supine Lumbar Exercises green tband leg press 15 each     Moist Heat Therapy   Number Minutes Moist Heat 15 Minutes   Moist Heat Location Lumbar Spine     Electrical Stimulation   Electrical Stimulation Location lumbar   Electrical Stimulation Action  IFC   Electrical Stimulation Parameters supine   Electrical Stimulation Goals Pain                  PT Short Term Goals - 01/09/17 0830      PT SHORT TERM GOAL #1   Title independent with initial HEP   Status Achieved           PT Long Term Goals - 01/19/17 0805      PT LONG TERM GOAL #1   Title decrease pain 50%   Status Achieved     PT LONG TERM GOAL #2   Title increase lumbar ROM 25%   Status Achieved     PT LONG TERM GOAL #3   Title increase SLR on the left to 70 degrees   Status Achieved     PT LONG TERM GOAL #4   Title understand proper posture and body mechanics   Baseline progressing but still needing cuing at times for correct BM and posture   Status Partially Met  Plan - 01/19/17 0806    Clinical Impression Statement all goals met except BM and posture on going- pt still needs cuing at times . Pt verb doing ex and stretches at home and PTA continues to educ on importance of complaince as well as educ on correct job mechanics.   PT Treatment/Interventions ADLs/Self Care Home Management;Moist Heat;Traction;Cryotherapy;Electrical Stimulation;Therapeutic activities;Therapeutic exercise;Balance training;Neuromuscular re-education;Patient/family education;Manual techniques   PT Next Visit Plan core/LE ROM amd strength. lifting and BM      Patient will benefit from skilled therapeutic intervention in order to improve the following deficits and impairments:  Decreased activity tolerance, Decreased mobility, Decreased strength, Postural dysfunction, Improper body mechanics, Impaired flexibility, Pain, Decreased range of motion, Difficulty walking  Visit Diagnosis: Acute left-sided low back pain with left-sided sciatica  Muscle spasm of back     Problem List Patient Active Problem List   Diagnosis Date Noted  . Esophageal dysphagia 11/09/2016  . Infected cyst of skin 12/10/2013  . Back pain 12/10/2013  . Major depression ([place])  07/06/2013  . Chest pain 03/12/2013  . Atypical chest pain 02/16/2013  . Family history of MI (myocardial infarction) 02/16/2013  . Wheezing 01/02/2013  . Grief reaction 08/05/2012  . Dysphagia 12/05/2011  . Anxiety 12/05/2011  . GERD (gastroesophageal reflux disease) 12/05/2011  . Pulmonary nodules 12/05/2011  . Dyspnea 12/03/2011  . Abnormal CT scan, chest 12/03/2011    PAYSEUR,ANGIE PTA 01/19/2017, 8:26 AM  [address] [place], [place], [address] Phone: [phone]   Fax:  [phone]  Name: Tracey Fox MRN: [id number] Date of Birth: 1956/03/21

## 2017-01-25 ENCOUNTER — Ambulatory Visit: Payer: PRIVATE HEALTH INSURANCE | Admitting: Orthopaedic Surgery

## 2017-02-16 ENCOUNTER — Ambulatory Visit: Payer: PRIVATE HEALTH INSURANCE | Admitting: Physical Therapy

## 2017-02-19 ENCOUNTER — Other Ambulatory Visit: Payer: Self-pay | Admitting: Family Medicine

## 2017-02-19 NOTE — Telephone Encounter (Signed)
Medication called to pharmacy. 

## 2017-02-19 NOTE — Telephone Encounter (Signed)
okay

## 2017-02-19 NOTE — Telephone Encounter (Signed)
Ok to refill 

## 2017-02-22 ENCOUNTER — Ambulatory Visit (HOSPITAL_COMMUNITY)
Admission: RE | Admit: 2017-02-22 | Discharge: 2017-02-22 | Disposition: A | Discharge status: Home / Self Care | Payer: No Typology Code available for payment source | Source: Ambulatory Visit | Attending: Internal Medicine | Admitting: Internal Medicine

## 2017-02-22 ENCOUNTER — Encounter (HOSPITAL_COMMUNITY): Payer: Self-pay | Admitting: *Deleted

## 2017-02-22 ENCOUNTER — Encounter (HOSPITAL_COMMUNITY)
Admission: RE | Disposition: A | Discharge status: Home / Self Care | Payer: Self-pay | Source: Ambulatory Visit | Attending: Internal Medicine

## 2017-02-22 DIAGNOSIS — R1319 Other dysphagia: Secondary | ICD-10-CM | POA: Insufficient documentation

## 2017-02-22 DIAGNOSIS — K2970 Gastritis, unspecified, without bleeding: Secondary | ICD-10-CM | POA: Insufficient documentation

## 2017-02-22 DIAGNOSIS — K317 Polyp of stomach and duodenum: Secondary | ICD-10-CM | POA: Diagnosis not present

## 2017-02-22 DIAGNOSIS — K449 Diaphragmatic hernia without obstruction or gangrene: Secondary | ICD-10-CM | POA: Diagnosis not present

## 2017-02-22 DIAGNOSIS — K2960 Other gastritis without bleeding: Secondary | ICD-10-CM

## 2017-02-22 DIAGNOSIS — Z79899 Other long term (current) drug therapy: Secondary | ICD-10-CM | POA: Diagnosis not present

## 2017-02-22 DIAGNOSIS — R1310 Dysphagia, unspecified: Secondary | ICD-10-CM

## 2017-02-22 DIAGNOSIS — F329 Major depressive disorder, single episode, unspecified: Secondary | ICD-10-CM | POA: Insufficient documentation

## 2017-02-22 DIAGNOSIS — R1314 Dysphagia, pharyngoesophageal phase: Secondary | ICD-10-CM

## 2017-02-22 DIAGNOSIS — K228 Other specified diseases of esophagus: Secondary | ICD-10-CM | POA: Insufficient documentation

## 2017-02-22 DIAGNOSIS — F419 Anxiety disorder, unspecified: Secondary | ICD-10-CM | POA: Diagnosis not present

## 2017-02-22 DIAGNOSIS — K210 Gastro-esophageal reflux disease with esophagitis: Secondary | ICD-10-CM | POA: Diagnosis not present

## 2017-02-22 HISTORY — PX: ESOPHAGEAL DILATION: SHX303

## 2017-02-22 HISTORY — PX: BIOPSY: SHX5522

## 2017-02-22 HISTORY — PX: ESOPHAGOGASTRODUODENOSCOPY: SHX5428

## 2017-02-22 SURGERY — EGD (ESOPHAGOGASTRODUODENOSCOPY)
Anesthesia: Moderate Sedation

## 2017-02-22 MED ORDER — MEPERIDINE HCL 50 MG/ML IJ SOLN
INTRAMUSCULAR | Status: DC | PRN
  Administered 2017-02-22 (×2): 25 mg via INTRAVENOUS

## 2017-02-22 MED ORDER — SODIUM CHLORIDE 0.9 % IV SOLN
INTRAVENOUS | Status: DC
  Administered 2017-02-22: 12:00:00 via INTRAVENOUS

## 2017-02-22 MED ORDER — MIDAZOLAM HCL 5 MG/5ML IJ SOLN
INTRAMUSCULAR | Status: DC | PRN
  Administered 2017-02-22 (×5): 2 mg via INTRAVENOUS

## 2017-02-22 MED ORDER — MEPERIDINE HCL 50 MG/ML IJ SOLN
INTRAMUSCULAR | Status: AC
  Filled 2017-02-22: qty 1

## 2017-02-22 MED ORDER — LIDOCAINE VISCOUS 2 % MT SOLN
OROMUCOSAL | Status: AC
  Filled 2017-02-22: qty 15

## 2017-02-22 MED ORDER — LIDOCAINE VISCOUS 2 % MT SOLN
OROMUCOSAL | Status: DC | PRN
  Administered 2017-02-22: 4 mL via OROMUCOSAL

## 2017-02-22 MED ORDER — STERILE WATER FOR IRRIGATION IR SOLN
Status: DC | PRN
  Administered 2017-02-22: 13:00:00

## 2017-02-22 MED ORDER — MIDAZOLAM HCL 5 MG/5ML IJ SOLN
INTRAMUSCULAR | Status: AC
  Filled 2017-02-22: qty 10

## 2017-02-22 NOTE — Discharge Instructions (Signed)
Resume usual medications including pantoprazole as before. Resume usual diet. No driving for 24 hours. Physician will call with results of blood test and biopsy.        Esophagogastroduodenoscopy, Care After Refer to this sheet in the next few weeks. These instructions provide you with information about caring for yourself after your procedure. Your health care provider may also give you more specific instructions. Your treatment has been planned according to current medical practices, but problems sometimes occur. Call your health care provider if you have any problems or questions after your procedure. What can I expect after the procedure? After the procedure, it is common to have:  A sore throat.  Nausea.  Bloating.  Dizziness.  Fatigue.  Follow these instructions at home:  Do not eat or drink anything until the numbing medicine (local anesthetic) has worn off and your gag reflex has returned. You will know that the local anesthetic has worn off when you can swallow comfortably.  Do not drive for 24 hours if you received a medicine to help you relax (sedative).  If your health care provider took a tissue sample for testing during the procedure, make sure to get your test results. This is your responsibility. Ask your health care provider or the department performing the test when your results will be ready.  Keep all follow-up visits as told by your health care provider. This is important. Contact a health care provider if:  You cannot stop coughing.  You are not urinating.  You are urinating less than usual. Get help right away if:  You have trouble swallowing.  You cannot eat or drink.  You have throat or chest pain that gets worse.  You are dizzy or light-headed.  You faint.  You have nausea or vomiting.  You have chills.  You have a fever.  You have severe abdominal pain.  You have black, tarry, or bloody stools. This information is not intended  to replace advice given to you by your health care provider. Make sure you discuss any questions you have with your health care provider. Document Released: 04/17/2012 Document Revised: 10/07/2015 Document Reviewed: 03/25/2015 Elsevier Interactive Patient Education  2018 [employer].     Hiatal Hernia A hiatal hernia occurs when part of the stomach slides above the muscle that separates the abdomen from the chest (diaphragm). A person can be born with a hiatal hernia (congenital), or it may develop over time. In almost all cases of hiatal hernia, only the top part of the stomach pushes through the diaphragm. Many people have a hiatal hernia with no symptoms. The larger the hernia, the more likely it is that you will have symptoms. In some cases, a hiatal hernia allows stomach acid to flow back into the tube that carries food from your mouth to your stomach (esophagus). This may cause heartburn symptoms. Severe heartburn symptoms may mean that you have developed a condition called gastroesophageal reflux disease (GERD). What are the causes? This condition is caused by a weakness in the opening (hiatus) where the esophagus passes through the diaphragm to attach to the upper part of the stomach. A person may be born with a weakness in the hiatus, or a weakness can develop over time. What increases the risk? This condition is more likely to develop in:  Older people. Age is a major risk factor for a hiatal hernia, especially if you are over the age of 46.  Pregnant women.  People who are overweight.  People who  have frequent constipation.  What are the signs or symptoms? Symptoms of this condition usually develop in the form of GERD symptoms. Symptoms include:  Heartburn.  Belching.  Indigestion.  Trouble swallowing.  Coughing or wheezing.  Sore throat.  Hoarseness.  Chest pain.  Nausea and vomiting.  How is this diagnosed? This condition may be diagnosed during testing for  GERD. Tests that may be done include:  X-rays of your stomach or chest.  An upper gastrointestinal (GI) series. This is an X-ray exam of your GI tract that is taken after you swallow a chalky liquid that shows up clearly on the X-ray.  Endoscopy. This is a procedure to look into your stomach using a thin, flexible tube that has a tiny camera and light on the end of it.  How is this treated? This condition may be treated by:  Dietary and lifestyle changes to help reduce GERD symptoms.  Medicines. These may include: ? Over-the-counter antacids. ? Medicines that make your stomach empty more quickly. ? Medicines that block the production of stomach acid (H2 blockers). ? Stronger medicines to reduce stomach acid (proton pump inhibitors).  Surgery to repair the hernia, if other treatments are not helping.  If you have no symptoms, you may not need treatment. Follow these instructions at home: Lifestyle and activity  Do not use any products that contain nicotine or tobacco, such as cigarettes and e-cigarettes. If you need help quitting, ask your health care provider.  Try to achieve and maintain a healthy body weight.  Avoid putting pressure on your abdomen. Anything that puts pressure on your abdomen increases the amount of acid that may be pushed up into your esophagus. ? Avoid bending over, especially after eating. ? Raise the head of your bed by putting blocks under the legs. This keeps your head and esophagus higher than your stomach. ? Do not wear tight clothing around your chest or stomach. ? Try not to strain when having a bowel movement, when urinating, or when lifting heavy objects. Eating and drinking  Avoid foods that can worsen GERD symptoms. These may include: ? Fatty foods, like fried foods. ? Citrus fruits, like oranges or lemon. ? Other foods and drinks that contain acid, like orange juice or tomatoes. ? Spicy food. ? Chocolate.  Eat frequent small meals instead  of three large meals a day. This helps prevent your stomach from getting too full. ? Eat slowly. ? Do not lie down right after eating. ? Do not eat 1-2 hours before bed.  Do not drink beverages with caffeine. These include cola, coffee, cocoa, and tea.  Do not drink alcohol. General instructions  Take over-the-counter and prescription medicines only as told by your health care provider.  Keep all follow-up visits as told by your health care provider. This is important. Contact a health care provider if:  Your symptoms are not controlled with medicines or lifestyle changes.  You are having trouble swallowing.  You have coughing or wheezing that will not go away. Get help right away if:  Your pain is getting worse.  Your pain spreads to your arms, neck, jaw, teeth, or back.  You have shortness of breath.  You sweat for no reason.  You feel sick to your stomach (nauseous) or you vomit.  You vomit blood.  You have bright red blood in your stools.  You have black, tarry stools. This information is not intended to replace advice given to you by your health care provider. Make sure  you discuss any questions you have with your health care provider. Document Released: 07/22/2003 Document Revised: 04/24/2016 Document Reviewed: 04/24/2016 Elsevier Interactive Patient Education  [employer].

## 2017-02-22 NOTE — Progress Notes (Signed)
Tracey Fox may not drive, operate heavy machinery, or sign legal documents until after 2pm on Friday Oct 12,2018

## 2017-02-22 NOTE — H&P (Signed)
Tracey Fox is an 61 y.o. female.   Chief Complaint: patient is here for EGD and ED. HPI: patient is  Is-year-old Caucasian female who presents withseveral month history of dysphagia to solids. It has been occurring more frequently over the last few months. She's had 3 episodes in the last 4 weeks. She points to suprasternal area site of bolus obstruction. She is eventually able to force it down with sips of water. She expresses heartburn occasionally. She is not taking PPI.she denies nausea vomiting melena or weight loss.  Past Medical History:  Diagnosis Date  . Anxiety   . Depression   . GERD (gastroesophageal reflux disease)     Past Surgical History:  Procedure Laterality Date  . ABDOMINAL HYSTERECTOMY    . BREAST SURGERY    . CHOLECYSTECTOMY      Family History  Problem Relation Age of Onset  . Heart disease Father   . Breast cancer Sister   . Cancer Sister   . COPD Brother        oldest brother   Social History:  reports that she has never smoked. She has never used smokeless tobacco. She reports that she drinks alcohol. She reports that she does not use drugs.  Allergies:  Allergies  Allergen Reactions  . Morphine Shortness Of Breath and Other (See Comments)    (difficulty breathing, shakiness, used NARCAN to reverse)    Medications Prior to Admission  Medication Sig Dispense Refill  . ALPRAZolam (XANAX) 1 MG tablet TAKE 1 TABLET BY MOUTH TWICE DAILY (Patient taking differently: TAKE 2 TABLET BY MOUTH AT BEDTIME FOR SLEEP.) 60 tablet 1  . naproxen (NAPROSYN) 500 MG tablet Take 1 tablet (500 mg total) by mouth 2 (two) times daily with a meal. (Patient taking differently: Take 500 mg by mouth 2 (two) times daily as needed (for back pain.). ) 60 tablet 5  . ciprofloxacin (CIPRO) 500 MG tablet Take 1 tablet (500 mg total) by mouth 2 (two) times daily. (Patient not taking: Reported on 02/19/2017) 14 tablet 0  . HYDROcodone-acetaminophen (NORCO/VICODIN) 5-325 MG tablet  Take one tab po q 4-6 hrs prn pain (Patient not taking: Reported on 02/19/2017) 12 tablet 0  . methocarbamol (ROBAXIN) 500 MG tablet Take 1 tablet (500 mg total) by mouth 3 (three) times daily. (Patient not taking: Reported on 02/19/2017) 21 tablet 0  . metroNIDAZOLE (FLAGYL) 500 MG tablet Take 1 tablet (500 mg total) by mouth 3 (three) times daily. (Patient not taking: Reported on 02/19/2017) 21 tablet 0  . pantoprazole (PROTONIX) 40 MG tablet Take 1 tablet (40 mg total) by mouth daily. (Patient not taking: Reported on 02/19/2017) 30 tablet 3  . promethazine (PHENERGAN) 25 MG tablet Take 1 tablet (25 mg total) by mouth every 6 (six) hours as needed for nausea or vomiting. (Patient not taking: Reported on 02/19/2017) 8 tablet 0    No results found for this or any previous visit (from the past 48 hour(s)). No results found.  ROS  Blood pressure 111/73, pulse (!) 55, temperature 98.6 F (37 C), temperature source Oral, resp. rate 14, height 5\' 8"  (1.727 m), weight 152 lb (68.9 kg), SpO2 97 %. Physical Exam  Constitutional: She appears well-developed and well-nourished.  HENT:  Mouth/Throat: Oropharynx is clear and moist.  Eyes: Conjunctivae are normal. No scleral icterus.  Neck: No thyromegaly present.  Cardiovascular: Normal rate, regular rhythm and normal heart sounds.   No murmur heard. Respiratory: Effort normal.  GI: Soft. She exhibits no  distension and no mass. There is no tenderness.  Musculoskeletal: She exhibits no edema.  Lymphadenopathy:    She has no cervical adenopathy.  Neurological: She is alert.  Skin: Skin is warm and dry.     Assessment/Plan Solid food dysphagia. EGD WITH ED.  Hildred Laser, MD 02/22/2017, 12:55 PM

## 2017-02-22 NOTE — Op Note (Signed)
[facility] Patient Name: Tracey Fox Procedure Date: 02/22/2017 12:18 PM MRN: [id number] Date of Birth: 08-30-1955 Attending MD: Hildred Laser , MD CSN: [id number] Age: 61 Admit Type: Outpatient Procedure:                Upper GI endoscopy Indications:              Esophageal dysphagia Providers:                Hildred Laser, MD, Jeanann Lewandowsky. Sharon Seller, RN, Randa Spike, Technician Referring MD:             Modena Nunnery. [place], MD Medicines:                Lidocaine spray, Meperidine 50 mg IV, Midazolam 10                            mg IV Complications:            No immediate complications. Estimated Blood Loss:     Estimated blood loss was minimal. Procedure:                Pre-Anesthesia Assessment:                           - Prior to the procedure, a History and Physical                            was performed, and patient medications and                            allergies were reviewed. The patient's tolerance of                            previous anesthesia was also reviewed. The risks                            and benefits of the procedure and the sedation                            options and risks were discussed with the patient.                            All questions were answered, and informed consent                            was obtained. Prior Anticoagulants: The patient has                            taken no previous anticoagulant or antiplatelet                            agents. ASA Grade Assessment: II - A patient with  mild systemic disease. After reviewing the risks                            and benefits, the patient was deemed in                            satisfactory condition to undergo the procedure.                           After obtaining informed consent, the endoscope was                            passed under direct vision. Throughout the                            procedure, the  patient's blood pressure, pulse, and                            oxygen saturations were monitored continuously. The                            [id number]) scope was introduced through the                            mouth, and advanced to the second part of duodenum.                            The upper GI endoscopy was accomplished without                            difficulty. The patient tolerated the procedure                            well. Scope In: 1:05:41 PM Scope Out: 1:19:58 PM Total Procedure Duration: 0 hours 14 minutes 17 seconds  Findings:      Diffuse mild mucosal changes characterized by granularity, altered       texture and vertical lines were found in the entire esophagus. The scope       was withdrawn. Dilation was performed with a Maloney dilator with mild       resistance at 3 Fr. The dilation site was examined following endoscope       reinsertion and showed no change and no bleeding, mucosal tear or       perforation. Biopsies were taken with a cold forceps for histology.      LA Grade A (one or more mucosal breaks less than 5 mm, not extending       between tops of 2 mucosal folds) esophagitis was found 35 cm from the       incisors.      A 3 cm hiatal hernia was present.      Patchy mild inflammation characterized by congestion (edema), erythema       and friability was found in the gastric antrum and in the prepyloric       region of the stomach.      The exam of the stomach was otherwise  normal.      The duodenal bulb and second portion of the duodenum were normal. Impression:               - Mucosal changes in the esophagus. Dilated.                            Biopsied.                           - LA Grade A reflux esophagitis.                           - 3 cm hiatal hernia.                           - Gastritis.                           - Normal duodenal bulb and second portion of the                            duodenum.                            Comment: incidental finding of 2 small patches of                            ectostric mucosa below UES> Moderate Sedation:      Moderate (conscious) sedation was administered by the endoscopy nurse       and supervised by the endoscopist. The following parameters were       monitored: oxygen saturation, heart rate, blood pressure, CO2       capnography and response to care. Total physician intraservice time was       19 minutes. Recommendation:           - Patient has a contact number available for                            emergencies. The signs and symptoms of potential                            delayed complications were discussed with the                            patient. Return to normal activities tomorrow.                            Written discharge instructions were provided to the                            patient.                           - Resume previous diet today.                           - Continue present medications.                           -  Await pathology results.                           - H. pylori serology. Procedure Code(s):        --- Professional ---                           [phone], Esophagogastroduodenoscopy, flexible,                            transoral; with biopsy, single or multiple                           43450, Dilation of esophagus, by unguided sound or                            bougie, single or multiple passes                           99152, Moderate sedation services provided by the                            same physician or other qualified health care                            professional performing the diagnostic or                            therapeutic service that the sedation supports,                            requiring the presence of an independent trained                            observer to assist in the monitoring of the                            patient's level of consciousness and physiological                             status; initial 15 minutes of intraservice time,                            patient age 26 years or older Diagnosis Code(s):        --- Professional ---                           K22.8, Other specified diseases of esophagus                           K21.0, Gastro-esophageal reflux disease with                            esophagitis  K44.9, Diaphragmatic hernia without obstruction or                            gangrene                           K29.70, Gastritis, unspecified, without bleeding                           R13.14, Dysphagia, pharyngoesophageal phase CPT copyright 2016 American Medical Association. All rights reserved. The codes documented in this report are preliminary and upon coder review may  be revised to meet current compliance requirements. Hildred Laser, MD Hildred Laser, MD 02/22/2017 1:38:05 PM This report has been signed electronically. Number of Addenda: 0

## 2017-02-23 LAB — H. PYLORI ANTIBODY, IGG

## 2017-02-27 ENCOUNTER — Encounter (HOSPITAL_COMMUNITY): Payer: Self-pay | Admitting: Internal Medicine

## 2017-03-08 ENCOUNTER — Telehealth: Payer: Self-pay | Admitting: *Deleted

## 2017-03-08 NOTE — Telephone Encounter (Signed)
Received fax with lab results.   MD reviewed and recommendations are as follows: Thyroid function is normal Blood sugar is normal Cholesterol is mildly elevated. Watch diet No new meds  Call placed to patient. [place].

## 2017-03-13 NOTE — Telephone Encounter (Signed)
Call placed to patient and patient made aware.  

## 2017-03-15 ENCOUNTER — Encounter: Payer: Self-pay | Admitting: Family Medicine

## 2017-04-16 ENCOUNTER — Other Ambulatory Visit: Payer: Self-pay | Admitting: Family Medicine

## 2017-04-16 NOTE — Telephone Encounter (Signed)
okay

## 2017-04-16 NOTE — Telephone Encounter (Signed)
Ok to refill??  Last office visit 11/01/2016.  Last refill 02/19/2017, #1 refill.

## 2017-04-17 NOTE — Telephone Encounter (Signed)
Medication called to pharmacy. 

## 2017-04-26 ENCOUNTER — Encounter: Payer: Self-pay | Admitting: Family Medicine

## 2017-06-14 ENCOUNTER — Other Ambulatory Visit: Payer: Self-pay | Admitting: Family Medicine

## 2017-06-14 NOTE — Telephone Encounter (Signed)
Ok to refill??  Last office visit 11/01/2016.  Last refill 04/17/2017, #1 refill.

## 2017-08-20 ENCOUNTER — Encounter: Payer: Self-pay | Admitting: *Deleted

## 2017-08-21 ENCOUNTER — Other Ambulatory Visit: Payer: Self-pay

## 2017-08-21 ENCOUNTER — Encounter: Payer: Self-pay | Admitting: Family Medicine

## 2017-08-21 ENCOUNTER — Ambulatory Visit: Payer: No Typology Code available for payment source | Admitting: Family Medicine

## 2017-08-21 VITALS — BP 128/64 | HR 70 | Temp 97.9°F | Resp 14 | Ht 68.0 in | Wt 167.0 lb

## 2017-08-21 DIAGNOSIS — K769 Liver disease, unspecified: Secondary | ICD-10-CM | POA: Diagnosis not present

## 2017-08-21 DIAGNOSIS — D739 Disease of spleen, unspecified: Secondary | ICD-10-CM | POA: Diagnosis not present

## 2017-08-21 DIAGNOSIS — S2241XD Multiple fractures of ribs, right side, subsequent encounter for fracture with routine healing: Secondary | ICD-10-CM | POA: Diagnosis not present

## 2017-08-21 DIAGNOSIS — E041 Nontoxic single thyroid nodule: Secondary | ICD-10-CM

## 2017-08-21 DIAGNOSIS — D7389 Other diseases of spleen: Secondary | ICD-10-CM

## 2017-08-21 DIAGNOSIS — R918 Other nonspecific abnormal finding of lung field: Secondary | ICD-10-CM
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
## 2017-08-21 NOTE — Patient Instructions (Signed)
PET Scan to be done F/U pending results

## 2017-08-22 ENCOUNTER — Ambulatory Visit: Payer: PRIVATE HEALTH INSURANCE | Admitting: Family Medicine

## 2017-08-22 ENCOUNTER — Other Ambulatory Visit: Payer: Self-pay | Admitting: Family Medicine

## 2017-08-22 ENCOUNTER — Encounter: Payer: Self-pay | Admitting: Family Medicine

## 2017-08-22 DIAGNOSIS — D7389 Other diseases of spleen: Secondary | ICD-10-CM

## 2017-08-22 DIAGNOSIS — R918 Other nonspecific abnormal finding of lung field: Secondary | ICD-10-CM

## 2017-08-22 DIAGNOSIS — D739 Disease of spleen, unspecified: Secondary | ICD-10-CM

## 2017-08-22 DIAGNOSIS — K769 Liver disease, unspecified: Secondary | ICD-10-CM

## 2017-08-22 LAB — COMPREHENSIVE METABOLIC PANEL
AG RATIO: 1.7 (calc) (ref 1.0–2.5)
ALBUMIN MSPROF: 4 g/dL (ref 3.6–5.1)
ALT: 17 U/L (ref 6–29)
AST: 23 U/L (ref 10–35)
Alkaline phosphatase (APISO): 117 U/L (ref 33–130)
BILIRUBIN TOTAL: 0.8 mg/dL (ref 0.2–1.2)
BUN: 13 mg/dL (ref 7–25)
CALCIUM: 9.3 mg/dL (ref 8.6–10.4)
CO2: 27 mmol/L (ref 20–32)
Chloride: 105 mmol/L (ref 98–110)
Creat: 0.65 mg/dL (ref 0.50–1.05)
Globulin: 2.3 g/dL (calc) (ref 1.9–3.7)
Glucose, Bld: 94 mg/dL (ref 65–99)
Potassium: 3.9 mmol/L (ref 3.5–5.3)
SODIUM: 139 mmol/L (ref 135–146)
TOTAL PROTEIN: 6.3 g/dL (ref 6.1–8.1)

## 2017-08-22 LAB — CBC WITH DIFFERENTIAL/PLATELET
Basophils Absolute: 38 cells/uL (ref 0–200)
Basophils Relative: 0.6 %
EOS PCT: 2.9 %
Eosinophils Absolute: 183 cells/uL (ref 15–500)
HEMATOCRIT: 42.2 % (ref 35.0–45.0)
HEMOGLOBIN: 14.2 g/dL (ref 11.7–15.5)
LYMPHS ABS: 2048 {cells}/uL (ref 850–3900)
MCH: 29.8 pg (ref 27.0–33.0)
MCHC: 33.6 g/dL (ref 32.0–36.0)
MCV: 88.7 fL (ref 80.0–100.0)
MONOS PCT: 9.4 %
MPV: 10.7 fL (ref 7.5–12.5)
NEUTROS ABS: 3440 {cells}/uL (ref 1500–7800)
Neutrophils Relative %: 54.6 %
Platelets: 264 10*3/uL (ref 140–400)
RBC: 4.76 10*6/uL (ref 3.80–5.10)
RDW: 11.4 % (ref 11.0–15.0)
Total Lymphocyte: 32.5 %
WBC mixed population: 592 cells/uL (ref 200–950)
WBC: 6.3 10*3/uL (ref 3.8–10.8)

## 2017-08-22 LAB — T3, FREE: T3 FREE: 2.9 pg/mL (ref 2.3–4.2)

## 2017-08-22 LAB — TSH: TSH: 0.63 mIU/L (ref 0.40–4.50)

## 2017-08-22 LAB — T4, FREE: Free T4: 1 ng/dL (ref 0.8–1.8)

## 2017-08-23 ENCOUNTER — Other Ambulatory Visit: Payer: Self-pay | Admitting: Family Medicine

## 2017-08-23 NOTE — Telephone Encounter (Signed)
Ok to refill??  Please clarify dosing directions and quantity.

## 2017-08-23 NOTE — Telephone Encounter (Signed)
Refill on oxycodone to [facility] [place].

## 2017-08-24 ENCOUNTER — Encounter (HOSPITAL_COMMUNITY)
Admission: RE | Admit: 2017-08-24 | Discharge: 2017-08-24 | Disposition: A | Discharge status: Home / Self Care | Payer: No Typology Code available for payment source | Source: Ambulatory Visit | Attending: Family Medicine | Admitting: Family Medicine

## 2017-08-24 ENCOUNTER — Other Ambulatory Visit: Payer: Self-pay | Admitting: *Deleted

## 2017-08-24 DIAGNOSIS — K769 Liver disease, unspecified: Secondary | ICD-10-CM | POA: Diagnosis present

## 2017-08-24 DIAGNOSIS — R911 Solitary pulmonary nodule: Secondary | ICD-10-CM

## 2017-08-24 DIAGNOSIS — E042 Nontoxic multinodular goiter: Secondary | ICD-10-CM

## 2017-08-24 DIAGNOSIS — D739 Disease of spleen, unspecified: Secondary | ICD-10-CM | POA: Diagnosis present

## 2017-08-24 DIAGNOSIS — R918 Other nonspecific abnormal finding of lung field: Secondary | ICD-10-CM | POA: Insufficient documentation

## 2017-08-24 LAB — GLUCOSE, CAPILLARY: GLUCOSE-CAPILLARY: 100 mg/dL — AB (ref 65–99)

## 2017-08-24 MED ORDER — FLUDEOXYGLUCOSE F - 18 (FDG) INJECTION
8.3500 | Freq: Once | INTRAVENOUS | Status: AC | PRN
Start: 2017-08-24 — End: 2017-08-24
  Administered 2017-08-24: 8.35 via INTRAVENOUS

## 2017-08-24 MED ORDER — OXYCODONE-ACETAMINOPHEN 7.5-325 MG PO TABS: 1.0000 | ORAL_TABLET | Freq: Four times a day (QID) | ORAL | 0 refills | Status: DC | PRN

## 2017-09-24 ENCOUNTER — Other Ambulatory Visit: Payer: Self-pay | Admitting: Family Medicine

## 2017-09-24 NOTE — Telephone Encounter (Signed)
Ok to refill??  Last office visit 08/21/2017.  Last refill 06/15/2017, #2 refills.

## 2017-09-26 ENCOUNTER — Ambulatory Visit (INDEPENDENT_AMBULATORY_CARE_PROVIDER_SITE_OTHER)
Admission: RE | Admit: 2017-09-26 | Discharge: 2017-09-26 | Disposition: A | Discharge status: Home / Self Care | Payer: PRIVATE HEALTH INSURANCE | Source: Ambulatory Visit | Attending: Internal Medicine | Admitting: Internal Medicine

## 2017-09-26 ENCOUNTER — Encounter: Payer: Self-pay | Admitting: Internal Medicine

## 2017-09-26 ENCOUNTER — Ambulatory Visit (INDEPENDENT_AMBULATORY_CARE_PROVIDER_SITE_OTHER): Payer: PRIVATE HEALTH INSURANCE | Admitting: Internal Medicine

## 2017-09-26 VITALS — BP 124/80 | HR 77 | Ht 68.0 in | Wt 164.0 lb

## 2017-09-26 DIAGNOSIS — R0609 Other forms of dyspnea: Secondary | ICD-10-CM | POA: Diagnosis not present

## 2017-09-26 DIAGNOSIS — R05 Cough: Secondary | ICD-10-CM | POA: Diagnosis not present

## 2017-09-26 DIAGNOSIS — R9389 Abnormal findings on diagnostic imaging of other specified body structures: Secondary | ICD-10-CM | POA: Diagnosis not present

## 2017-09-26 DIAGNOSIS — R058 Other specified cough: Secondary | ICD-10-CM

## 2017-09-26 MED ORDER — FAMOTIDINE 20 MG PO TABS: ORAL_TABLET | ORAL | 11 refills | Status: DC

## 2017-09-26 MED ORDER — TRAMADOL HCL 50 MG PO TABS: 50.0000 mg | ORAL_TABLET | ORAL | 2 refills | Status: AC | PRN

## 2017-09-26 MED ORDER — PANTOPRAZOLE SODIUM 40 MG PO TBEC: 40.0000 mg | DELAYED_RELEASE_TABLET | Freq: Every day | ORAL | 3 refills | Status: DC

## 2017-09-26 NOTE — Assessment & Plan Note (Addendum)
Upper airway cough syndrome (previously labeled PNDS),  is so named because it's frequently impossible to sort out how much is  CR/sinusitis with freq throat clearing (which can be related to primary GERD)   vs  causing  secondary (" extra esophageal")  GERD from wide swings in gastric pressure that occur with throat clearing, often  promoting self use of mint and menthol lozenges that reduce the lower esophageal sphincter tone and exacerbate the problem further in a cyclical fashion.   These are the same pts (now being labeled as having "irritable larynx syndrome" by some cough centers) who not infrequently have a history of having failed to tolerate ace inhibitors,  dry powder inhalers or biphosphonates or report having atypical/extraesophageal reflux symptoms that don't respond to standard doses of PPI  and are easily confused as having aecopd or asthma flares by even experienced allergists/ pulmonologists (myself included).  Of the three most common causes of  Sub-acute / recurrent or chronic cough, only one (GERD)  can actually contribute to/ trigger  the other two (asthma and post nasal drip syndrome)  and perpetuate the cylce of cough.  While not intuitively obvious, many patients with chronic low grade reflux do not cough until there is a primary insult that disturbs the protective epithelial barrier and exposes sensitive nerve endings.   This is typically viral but can due to PNDS and  either may apply here.     The point is that once this occurs, it is difficult to eliminate the cycle  using anything but a maximally effective acid suppression regimen at least in the short run, accompanied by an appropriate diet to address non acid GERD and control / eliminate the cough itself for at least 5-7 days with tramadol which is what she probably needs anyway to help the rib fx's heal (no coughing)   Pulmonary f/u in 6 weeks, sooner if needed    Total time devoted to counseling  > 50 % of initial 60  min office visit:  review case with pt/ discussion of options/alternatives/ personally creating written customized instructions  in presence of pt  then going over those specific  Instructions directly with the pt including how to use all of the meds but in particular covering each new medication in detail and the difference between the maintenance= "automatic" meds and the prns using an action plan format for the latter (If this problem/symptom => do that organization reading Left to right).  Please see AVS from this visit for a full list of these instructions which I personally wrote for this pt and  are unique to this visit.

## 2017-09-26 NOTE — Patient Instructions (Addendum)
Take delsym two tsp every 12 hours and supplement if needed with  tramadol 50 mg up to 2 every 4 hours to suppress the urge to cough. Swallowing water and/or using ice chips/non mint and menthol containing candies (such as lifesavers or sugarless jolly ranchers) are also effective.  You should rest your voice and avoid activities that you know make you cough.  Once you have eliminated the cough for 3 straight days try reducing the tramadol first,  then the delsym as tolerated.    GERD (REFLUX)  is an extremely common cause of respiratory symptoms just like yours , many times with no obvious heartburn at all.    It can be treated with medication, but also with lifestyle changes including elevation of the head of your bed (ideally with 6 inch  bed blocks),  Smoking cessation, avoidance of late meals, excessive alcohol, and avoid fatty foods, chocolate, peppermint, colas, red wine, and acidic juices such as orange juice.  NO MINT OR MENTHOL PRODUCTS SO NO COUGH DROPS   USE SUGARLESS CANDY INSTEAD (Jolley ranchers or Stover's or Life Savers) or even ice chips will also do - the key is to swallow to prevent all throat clearing. NO OIL BASED VITAMINS - use powdered substitutes.    Please remember to go to the  x-ray department downstairs in the basement  for your tests - we will call you with the results when they are available.  Please schedule a follow up office visit in 6 weeks, call sooner if needed
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
## 2017-09-27 ENCOUNTER — Encounter: Payer: Self-pay | Admitting: Internal Medicine

## 2017-09-27 NOTE — Assessment & Plan Note (Signed)
-   See CT Chest 08/2010 vs CT heart 10/30/11 vs PET 08/24/17 :  rul same since 2012 per radiology review (see addendum to pet report)   She is a never smoker with stable RUL nodule since 2012.  I went over all her scans dating back to 2012 with radiology and agree this is the same lesion we previously evaluated as benign and no directed f/u needed.

## 2017-09-27 NOTE — Assessment & Plan Note (Signed)
Mild/chronic and probably related to conditioning though can be seen with chronic UACS (see separate a/p)   No further w/u at this point - could do more formal w/u at later date if really  limited by breathing from desired activities

## 2017-09-27 NOTE — Progress Notes (Signed)
Spoke with pt and notified of results per Dr. Wert. Pt verbalized understanding and denied any questions. 

## 2017-10-18 ENCOUNTER — Encounter: Payer: Self-pay | Admitting: Endocrinology

## 2017-10-18 ENCOUNTER — Ambulatory Visit (INDEPENDENT_AMBULATORY_CARE_PROVIDER_SITE_OTHER): Payer: PRIVATE HEALTH INSURANCE | Admitting: Endocrinology

## 2017-10-18 DIAGNOSIS — E042 Nontoxic multinodular goiter: Secondary | ICD-10-CM | POA: Diagnosis not present

## 2017-10-18 NOTE — Progress Notes (Signed)
Subjective:    Patient ID: Tracey Fox, female    DOB: 15-Nov-1955, 62 y.o.   MRN: [id number]  HPI Pt is referred by Dr Buelah Manis, for nodular thyroid.  A few weeks ago, pt was incidentally noted on a PET CT, to have a nodular thyroid.  she is unaware of ever having had thyroid problems in the past.  she has no h/o XRT or surgery to the neck.  She had moderate fatigue, and assoc weight gain Past Medical History:  Diagnosis Date  . Anxiety   . Depression   . GERD (gastroesophageal reflux disease)   . Lung cancer ([place])    Brother    Past Surgical History:  Procedure Laterality Date  . ABDOMINAL HYSTERECTOMY    . BIOPSY  02/22/2017   Procedure: BIOPSY;  Surgeon: Rogene Houston, MD;  Location: [facility];  Service: Endoscopy;;  esophagus  . BREAST SURGERY    . CHOLECYSTECTOMY    . ESOPHAGEAL DILATION N/A 02/22/2017   Procedure: ESOPHAGEAL DILATION;  Surgeon: Rogene Houston, MD;  Location: [facility];  Service: Endoscopy;  Laterality: N/A;  . ESOPHAGOGASTRODUODENOSCOPY N/A 02/22/2017   Procedure: ESOPHAGOGASTRODUODENOSCOPY (EGD);  Surgeon: Rogene Houston, MD;  Location: [facility];  Service: Endoscopy;  Laterality: N/A;  1:55    Social History   Socioeconomic History  . Marital status: Divorced    Spouse name: Not on file  . Number of children: 4  . Years of education: Not on file  . Highest education level: Not on file  Occupational History  . Occupation: Glass blower/designer    Employer: [employer]  Social Needs  . Financial resource strain: Not on file  . Food insecurity:    Worry: Not on file    Inability: Not on file  . Transportation needs:    Medical: Not on file    Non-medical: Not on file  Tobacco Use  . Smoking status: Never Smoker  . Smokeless tobacco: Never Used  Substance and Sexual Activity  . Alcohol use: Yes    Comment: occ  . Drug use: No  . Sexual activity: Not on file  Lifestyle  . Physical activity:    Days per week: Not on file   Minutes per session: Not on file  . Stress: Not on file  Relationships  . Social connections:    Talks on phone: Not on file    Gets together: Not on file    Attends religious service: Not on file    Active member of club or organization: Not on file    Attends meetings of clubs or organizations: Not on file    Relationship status: Not on file  . Intimate partner violence:    Fear of current or ex partner: Not on file    Emotionally abused: Not on file    Physically abused: Not on file    Forced sexual activity: Not on file  Other Topics Concern  . Not on file  Social History Narrative  . Not on file    Current Outpatient Medications on File Prior to Visit  Medication Sig Dispense Refill  . ALPRAZolam (XANAX) 1 MG tablet TAKE 1 TABLET BY MOUTH TWICE DAILY AS NEEDED 60 tablet 0  . famotidine (PEPCID) 20 MG tablet One at bedtime (Patient not taking: Reported on 10/18/2017) 30 tablet 11  . naproxen (NAPROSYN) 500 MG tablet Take 1 tablet (500 mg total) by mouth 2 (two) times daily with a meal. (Patient taking differently:  Take 500 mg by mouth 2 (two) times daily as needed (for back pain.). ) 60 tablet 5  . pantoprazole (PROTONIX) 40 MG tablet Take 1 tablet (40 mg total) by mouth daily. 30 tablet 3   No current facility-administered medications on file prior to visit.     Allergies  Allergen Reactions  . Morphine Shortness Of Breath and Other (See Comments)    (difficulty breathing, shakiness, used NARCAN to reverse)    Family History  Problem Relation Age of Onset  . Heart disease Father   . Breast cancer Sister   . Cancer Sister   . COPD Brother        oldest brother  . Lung cancer Brother   . Thyroid disease Neg Hx     BP 108/68   Pulse 78   Wt 162 lb 12.8 oz (73.8 kg)   SpO2 95%   BMI 24.75 kg/m    Review of Systems Denies fever, hoarseness, neck pain, diplopia, chest pain, sob, dysphagia, diarrhea, itching, flushing, easy bruising, depression, headache, and  numbness.  She has dry skin, rhinorrhea, cold intolerance, and slight cough.      Objective:   Physical Exam VS: see vs page GEN: no distress HEAD: head: no deformity eyes: no periorbital swelling, no proptosis external nose and ears are normal mouth: no lesion seen NECK: supple, thyroid is not enlarged.  I cannot palpate the nodules CHEST WALL: no deformity LUNGS: clear to auscultation CV: reg rate and rhythm, no murmur ABD: abdomen is soft, nontender.  no hepatosplenomegaly.  not distended.  no hernia MUSCULOSKELETAL: muscle bulk and strength are grossly normal.  no obvious joint swelling.  gait is normal and steady EXTEMITIES: no deformity.  no edema PULSES: no carotid bruit NEURO:  cn 2-12 grossly intact.   readily moves all 4's.  sensation is intact to touch on all 4's SKIN:  Normal texture and temperature.  No rash or suspicious lesion is visible.   NODES:  None palpable at the neck PSYCH: alert, well-oriented.  Does not appear anxious nor depressed.    Lab Results  Component Value Date   TSH 0.63 08/21/2017   PET CT: Multinodular thyroid goiter.  No areas of hypermetabolism.  I have reviewed outside records, and summarized: Pt was noted to have nodular goiter, and referred here.  PET CT was done to eval abnormal CT of the chest, and thyroid abnormality was incidentally noted      Assessment & Plan:  Multinodular goiter, new, uncertain etiology   Patient Instructions  Let's recheck the ultrasound.  you will receive a phone call, about a day and time for an appointment. If the ultrasound is fine, please come back for a follow-up appointment in 1 year. most of the time, a "lumpy thyroid" will eventually become overactive.  this is usually a slow process, happening over the span of many years.

## 2017-10-18 NOTE — Patient Instructions (Signed)
Let's recheck the ultrasound.  you will receive a phone call, about a day and time for an appointment. If the ultrasound is fine, please come back for a follow-up appointment in 1 year. most of the time, a "lumpy thyroid" will eventually become overactive.  this is usually a slow process, happening over the span of many years.

## 2017-10-25 ENCOUNTER — Other Ambulatory Visit: Payer: Self-pay | Admitting: Family Medicine

## 2017-10-25 NOTE — Telephone Encounter (Signed)
Ok to refill??  Last office visit 08/21/2017.  Last refill 09/24/2017.

## 2017-11-07 ENCOUNTER — Ambulatory Visit: Payer: PRIVATE HEALTH INSURANCE | Admitting: Internal Medicine

## 2017-11-23 ENCOUNTER — Telehealth: Payer: Self-pay | Admitting: *Deleted
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
## 2018-01-21 ENCOUNTER — Other Ambulatory Visit: Payer: Self-pay | Admitting: Family Medicine

## 2018-01-21 NOTE — Telephone Encounter (Signed)
Ok to refill??  Last office visit 08/22/2107.  Last refill 10/26/2017.

## 2018-04-22 ENCOUNTER — Other Ambulatory Visit: Payer: Self-pay | Admitting: Family Medicine

## 2018-04-22 NOTE — Telephone Encounter (Signed)
Ok to refill??  Last office visit  08/21/2017.  Last refill 01/21/2018, #2 refills.

## 2018-07-17 ENCOUNTER — Other Ambulatory Visit: Payer: Self-pay | Admitting: Family Medicine

## 2018-07-18 NOTE — Telephone Encounter (Signed)
Ok to refill??  Last office visit 08/21/2017.  Last refill 04/22/2018, #2 refills.

## 2018-10-15 ENCOUNTER — Other Ambulatory Visit: Payer: Self-pay | Admitting: Family Medicine

## 2018-10-23 ENCOUNTER — Ambulatory Visit (INDEPENDENT_AMBULATORY_CARE_PROVIDER_SITE_OTHER): Payer: No Typology Code available for payment source | Admitting: Family Medicine

## 2018-10-23 ENCOUNTER — Other Ambulatory Visit: Payer: Self-pay

## 2018-10-23 ENCOUNTER — Encounter: Payer: Self-pay | Admitting: Family Medicine

## 2018-10-23 ENCOUNTER — Ambulatory Visit: Payer: No Typology Code available for payment source | Admitting: Family Medicine

## 2018-10-23 VITALS — BP 110/68 | HR 74 | Temp 99.5°F | Resp 14 | Ht 68.0 in | Wt 160.0 lb

## 2018-10-23 DIAGNOSIS — F419 Anxiety disorder, unspecified: Secondary | ICD-10-CM | POA: Diagnosis not present

## 2018-10-23 DIAGNOSIS — D2230 Melanocytic nevi of unspecified part of face: Secondary | ICD-10-CM

## 2018-10-23 DIAGNOSIS — L720 Epidermal cyst: Secondary | ICD-10-CM

## 2018-10-23 MED ORDER — ALPRAZOLAM 1 MG PO TABS: 1.0000 mg | ORAL_TABLET | Freq: Two times a day (BID) | ORAL | 2 refills | Status: DC | PRN

## 2018-10-23 NOTE — Assessment & Plan Note (Signed)
Continue xanax twice a day as needed Return for CPE and fasting labs

## 2018-10-23 NOTE — Patient Instructions (Addendum)
F/U 6 months for Physical  Referral to dermatology

## 2018-10-23 NOTE — Progress Notes (Signed)
   Subjective:    Patient ID: Tracey Fox, female    DOB: [birth year], 63 y.o.   MRN: [id number]  Patient presents for Medication Management and Medication Refill   Pt here to f/u chronic medical problems   Seen by Dr. Loanne Drilling- for thyroid goiter, normal labs     Has lUNG nodule seen by pulmonary benign nodule, negative on PET scan no chronic cough    GAD- no major changes, still has trouble sleeping without her medications. Still gets worked up easily if things worry.   taking daily   Most days takes only at night, on occ takes the morning dose     Often drinks ensure because she is busy at work and doesn't stop for lunch  Has spot on forehead for past few months, no change in size     Review Of Systems:  GEN- denies fatigue, fever, weight loss,weakness, recent illness HEENT- denies eye drainage, change in vision, nasal discharge, CVS- denies chest pain, palpitations RESP- denies SOB, cough, wheeze ABD- denies N/V, change in stools, abd pain GU- denies dysuria, hematuria, dribbling, incontinence MSK- denies joint pain, muscle aches, injury Neuro- denies headache, dizziness, syncope, seizure activity       Objective:    BP 110/68   Pulse 74   Temp 99.5 F (37.5 C) (Oral)   Resp 14   Ht 5\' 8"  (1.727 m)   Wt 160 lb (72.6 kg)   SpO2 98%   BMI 24.33 kg/m  GEN- NAD, alert and oriented x3 HEENT- PERRL, EOMI, non injected sclera, pink conjunctiva, MMM, oropharynx clear Neck- Supple, no thyromegaly CVS- RRR, no murmur RESP-CTAB ABD-NABS,soft,NT,ND EXT- No edema Psych- typical affect and mood, always a little anxious  Pulses- Radial, DP- 2+ Skin- small cyst, at bra line , center forehead at hairline flesh toned nevus black        Assessment & Plan:      Problem List Items Addressed This Visit      Unprioritized   Anxiety - Primary    Continue xanax twice a day as needed Return for CPE and fasting labs       Relevant Medications   ALPRAZolam (XANAX)  1 MG tablet    Other Visit Diagnoses    Epidermal cyst       Relevant Orders   Ambulatory referral to Dermatology   Nevus of face       Relevant Orders   Ambulatory referral to Dermatology      Note: This dictation was prepared with Dragon dictation along with smaller phrase technology. Any transcriptional errors that result from this process are unintentional.

## 2019-01-17 ENCOUNTER — Other Ambulatory Visit: Payer: Self-pay | Admitting: Family Medicine

## 2019-01-17 NOTE — Telephone Encounter (Signed)
Requested Prescriptions   Pending Prescriptions Disp Refills  . ALPRAZolam (XANAX) 1 MG tablet [Pharmacy Med Name: ALPRAZOLAM 1MG  TABLETS] 60 tablet     Sig: TAKE 1 TABLET(1 MG) BY MOUTH TWICE DAILY AS NEEDED    Last OV 10/23/2018  Last written 10/23/2018

## 2019-03-18 ENCOUNTER — Other Ambulatory Visit: Payer: Self-pay | Admitting: Family Medicine

## 2019-03-18 NOTE — Telephone Encounter (Signed)
Last refilled: 01/17/2019 Last office visit: 10/23/2018

## 2019-04-19 ENCOUNTER — Other Ambulatory Visit: Payer: Self-pay

## 2019-04-19 ENCOUNTER — Encounter: Payer: Self-pay | Admitting: Emergency Medicine

## 2019-04-19 ENCOUNTER — Ambulatory Visit
Admission: EM | Admit: 2019-04-19 | Discharge: 2019-04-19 | Disposition: A | Discharge status: Home / Self Care | Payer: No Typology Code available for payment source | Attending: Emergency Medicine | Admitting: Emergency Medicine

## 2019-04-19 ENCOUNTER — Ambulatory Visit (INDEPENDENT_AMBULATORY_CARE_PROVIDER_SITE_OTHER): Payer: No Typology Code available for payment source

## 2019-04-19 DIAGNOSIS — R5383 Other fatigue: Secondary | ICD-10-CM | POA: Diagnosis not present

## 2019-04-19 DIAGNOSIS — R0609 Other forms of dyspnea: Secondary | ICD-10-CM

## 2019-04-19 DIAGNOSIS — R0600 Dyspnea, unspecified: Secondary | ICD-10-CM | POA: Diagnosis not present

## 2019-04-19 DIAGNOSIS — Z20828 Contact with and (suspected) exposure to other viral communicable diseases: Secondary | ICD-10-CM

## 2019-04-19 NOTE — Discharge Instructions (Signed)
Your COVID test is pending - it is important to quarantine / isolate at home until your results are back. °If you test positive and would like further evaluation for persistent or worsening symptoms, you may schedule an E-visit or virtual (video) visit throughout the [facility] MyChart app or website. ° °PLEASE NOTE: If you develop severe chest pain or shortness of breath please go to the ER or call [phone] for further evaluation --> DO NOT schedule electronic or virtual visits for this. °Please call our office for further guidance / recommendations as needed. °

## 2019-04-19 NOTE — ED Notes (Signed)
Patient able to ambulate independently  

## 2019-04-19 NOTE — ED Provider Notes (Signed)
[facility]    CSN: [id number] Arrival date & time: 04/19/19  1157      History   Chief Complaint Chief Complaint  Patient presents with  . Shortness of Breath    HPI Tracey Fox is a 63 y.o. female with history of depression, anxiety, GERD presented for 1 week course of intermittent shortness of breath, fatigue.  Patient states dyspnea is worse with exertion and prolonged standing, though this does not seem to be consistent.  Patient denies upper, lower extremity edema.  States she does have some abdominal bloating, thinks she is retaining fluid due to 2 to 3 pound weight gain since symptom onset.  Patient denies history of heart failure, chest pain, cough, fever, known sick contacts.  Has not tried any thing for symptoms.  Past Medical History:  Diagnosis Date  . Anxiety   . Depression   . GERD (gastroesophageal reflux disease)   . Lung cancer [facility])    Brother    Patient Active Problem List   Diagnosis Date Noted  . Multinodular goiter 10/18/2017  . Upper airway cough syndrome 09/26/2017  . Esophageal dysphagia 11/09/2016  . Infected cyst of skin 12/10/2013  . Back pain 12/10/2013  . Major depression ([place]) 07/06/2013  . Chest pain 03/12/2013  . Atypical chest pain 02/16/2013  . Family history of MI (myocardial infarction) 02/16/2013  . Wheezing 01/02/2013  . Grief reaction 08/05/2012  . Dysphagia 12/05/2011  . Anxiety 12/05/2011  . GERD (gastroesophageal reflux disease) 12/05/2011  . Pulmonary nodules 12/05/2011  . DOE (dyspnea on exertion) 12/03/2011  . Abnormal CT scan, chest 12/03/2011    Past Surgical History:  Procedure Laterality Date  . ABDOMINAL HYSTERECTOMY    . BIOPSY  02/22/2017   Procedure: BIOPSY;  Surgeon: Rogene Houston, MD;  Location: [facility];  Service: Endoscopy;;  esophagus  . BREAST SURGERY    . CHOLECYSTECTOMY    . ESOPHAGEAL DILATION N/A 02/22/2017   Procedure: ESOPHAGEAL DILATION;  Surgeon: Rogene Houston, MD;   Location: [facility];  Service: Endoscopy;  Laterality: N/A;  . ESOPHAGOGASTRODUODENOSCOPY N/A 02/22/2017   Procedure: ESOPHAGOGASTRODUODENOSCOPY (EGD);  Surgeon: Rogene Houston, MD;  Location: [facility];  Service: Endoscopy;  Laterality: N/A;  1:55    OB History   No obstetric history on file.      Home Medications    Prior to Admission medications   Medication Sig Start Date End Date Taking? Authorizing Provider  ALPRAZolam Duanne Moron) 1 MG tablet TAKE 1 TABLET(1 MG) BY MOUTH TWICE DAILY AS NEEDED 03/18/19   Alycia Rossetti, MD    Family History Family History  Problem Relation Age of Onset  . Heart disease Father   . Breast cancer Sister   . Cancer Sister   . COPD Brother        oldest brother  . Lung cancer Brother   . Thyroid disease Neg Hx     Social History Social History   Tobacco Use  . Smoking status: Never Smoker  . Smokeless tobacco: Never Used  Substance Use Topics  . Alcohol use: Yes    Comment: occ  . Drug use: No     Allergies   Morphine   Review of Systems Review of Systems  Constitutional: Positive for fatigue. Negative for activity change, appetite change and fever.  HENT: Negative for ear pain, sinus pain, sore throat and voice change.   Eyes: Negative for pain, redness and visual disturbance.  Respiratory: Positive  for shortness of breath. Negative for cough, wheezing and stridor.   Cardiovascular: Negative for chest pain, palpitations and leg swelling.  Gastrointestinal: Positive for abdominal distention. Negative for abdominal pain, diarrhea, nausea and vomiting.  Musculoskeletal: Negative for arthralgias and myalgias.  Skin: Negative for rash and wound.  Neurological: Negative for syncope and headaches.     Physical Exam Triage Vital Signs ED Triage Vitals  Enc Vitals Group     BP      Pulse      Resp      Temp      Temp src      SpO2      Weight      Height      Head Circumference      Peak Flow      Pain Score       Pain Loc      Pain Edu?      Excl. in [place]?    No data found.  Updated Vital Signs BP 128/82 (BP Location: Right Arm)   Pulse 66   Temp 98.9 F (37.2 C) (Temporal)   Resp 18   SpO2 96%   Visual Acuity Right Eye Distance:   Left Eye Distance:   Bilateral Distance:    Right Eye Near:   Left Eye Near:    Bilateral Near:     Physical Exam Vitals signs reviewed.  Constitutional:      General: She is not in acute distress.    Appearance: She is well-developed and normal weight. She is not ill-appearing.  HENT:     Head: Normocephalic and atraumatic.  Eyes:     General: No scleral icterus.       Right eye: No discharge.        Left eye: No discharge.     Extraocular Movements: Extraocular movements intact.     Pupils: Pupils are equal, round, and reactive to light.  Neck:     Musculoskeletal: Normal range of motion and neck supple. No muscular tenderness.     Vascular: No JVD.     Trachea: No tracheal deviation.  Cardiovascular:     Rate and Rhythm: Normal rate and regular rhythm.     Pulses: Normal pulses.     Heart sounds: No murmur.  Pulmonary:     Effort: Pulmonary effort is normal. No tachypnea, accessory muscle usage or respiratory distress.     Breath sounds: No stridor. No wheezing.  Chest:     Chest wall: No tenderness or crepitus.  Abdominal:     General: Abdomen is flat. Bowel sounds are normal.     Palpations: Abdomen is soft. There is no hepatomegaly or splenomegaly.     Tenderness: There is no abdominal tenderness. There is no guarding or rebound.  Musculoskeletal:     Right lower leg: She exhibits no tenderness. No edema.     Left lower leg: She exhibits no tenderness. No edema.  Lymphadenopathy:     Cervical: No cervical adenopathy.  Skin:    General: Skin is warm.     Capillary Refill: Capillary refill takes less than 2 seconds.     Coloration: Skin is not cyanotic, jaundiced or pale.     Findings: No rash.  Neurological:     General: No  focal deficit present.     Mental Status: She is alert and oriented to person, place, and time.  Psychiatric:        Mood and Affect: Mood normal.  Thought Content: Thought content normal.      UC Treatments / Results  Labs (all labs ordered are listed, but only abnormal results are displayed) Labs Reviewed  NOVEL CORONAVIRUS, NAA    EKG   Radiology Dg Chest 2 View  Result Date: 04/19/2019 CLINICAL DATA:  Dyspnea on exertion x1 week EXAM: CHEST - 2 VIEW COMPARISON:  09/26/2017 FINDINGS: Lungs are clear.  No pleural effusion or pneumothorax. The heart is normal in size. Visualized osseous structures are within normal limits. Cholecystectomy clips. IMPRESSION: Normal chest radiographs. Electronically Signed   By: Julian Hy M.D.   On: 04/19/2019 13:08    Procedures Procedures (including critical care time)  Medications Ordered in UC Medications - No data to display  Initial Impression / Assessment and Plan / UC Course  I have reviewed the triage vital signs and the nursing notes.  Pertinent labs & imaging results that were available during my care of the patient were reviewed by me and considered in my medical decision making (see chart for details).     Patient afebrile, nontoxic.  EKG done in office, reviewed by me and compared to previous from 2014: Sinus bradycardia with ventricular rate of 54.  No QTC prolongation, ST elevation or specific T wave abnormality.  Mildly improved from previous reading.  CXR done in office, reviewed by me and radiology and compared to May 2019 image: Negative for pleural effusion, pneumothorax, consolidation, or pulmonary edema; stable.  Reviewed findings with patient who verbalized understanding.  Patient denies history of anemia, blood loss.  Covid PCR pending: Patient quarantine till results are back.  Patient will follow-up with PCP Monday.  Return precautions discussed, patient verbalized understanding and is agreeable to plan.   Final Clinical Impressions(s) / UC Diagnoses   Final diagnoses:  DOE (dyspnea on exertion)  Fatigue, unspecified type     Discharge Instructions     Your COVID test is pending - it is important to quarantine / isolate at home until your results are back. If you test positive and would like further evaluation for persistent or worsening symptoms, you may schedule an E-visit or virtual (video) visit throughout the [facility] app or website.  PLEASE NOTE: If you develop severe chest pain or shortness of breath please go to the ER or call [phone] for further evaluation --> DO NOT schedule electronic or virtual visits for this. Please call our office for further guidance / recommendations as needed.    ED Prescriptions    None     PDMP not reviewed this encounter.   [place], [place], [place] 04/19/19 1545

## 2019-04-19 NOTE — ED Triage Notes (Signed)
Pt presents to [facility] for assessment of 1 week of intermittent shortness of breath, especially with standing for long times.  States she has also been feeling fatigued.  C/o 2-3lb weight gain in the last week as well.

## 2019-04-22 ENCOUNTER — Ambulatory Visit (INDEPENDENT_AMBULATORY_CARE_PROVIDER_SITE_OTHER): Payer: No Typology Code available for payment source | Admitting: Family Medicine

## 2019-04-22 ENCOUNTER — Other Ambulatory Visit: Payer: Self-pay

## 2019-04-22 DIAGNOSIS — R0600 Dyspnea, unspecified: Secondary | ICD-10-CM | POA: Diagnosis not present

## 2019-04-22 DIAGNOSIS — B349 Viral infection, unspecified: Secondary | ICD-10-CM

## 2019-04-22 DIAGNOSIS — R0609 Other forms of dyspnea: Secondary | ICD-10-CM

## 2019-04-22 LAB — NOVEL CORONAVIRUS, NAA: SARS-CoV-2, NAA: NOT DETECTED

## 2019-04-22 NOTE — Progress Notes (Signed)
Virtual Visit via Telephone Note  I connected with Tracey Fox on 04/22/19 at 2:38am  by telephone and verified that I am speaking with the correct person using two identifiers.      Pt location: at home   Physician location:  In office, [facility], Vic Blackbird MD     On call: patient and physician   I discussed the limitations, risks, security and privacy concerns of performing an evaluation and management service by telephone and the availability of in person appointments. I also discussed with the patient that there may be a patient responsible charge related to this service. The patient expressed understanding and agreed to proceed.   History of Present Illness: Last Monday the 30th started with fatigue/ headache/ SOB, no fever, she was taking ibuprofen for the headache. No sinus draing, no change in taste or smell.  No GI symptoms. She was seen on 12/5 at Urgent Care work-up was negative.  Chest x-ray was normal nothing acute found on EKG Covid testing was also negative.  She still has mild headache but her shortness of breath has improved does not have any cough feels like she can return to work.  Her appetite is good.    Reviewed urgent care imaging and work-up Observations/Objective: No acute distress noted over the phone  Assessment and Plan: Probable viral illness nothing else found with regards to the shortness of breath and fatigue that she was experiencing.   It has been 10 days since her symptoms onset and she has negative Covid testing so I will release her back to work. If she has recurrence of symptoms or changes she will alert the office and she will be re evaluated Follow Up Instructions:    I discussed the assessment and treatment plan with the patient. The patient was provided an opportunity to ask questions and all were answered. The patient agreed with the plan and demonstrated an understanding of the instructions.   The patient was advised  to call back or seek an in-person evaluation if the symptoms worsen or if the condition fails to improve as anticipated.  I provided 10 minutes of non-face-to-face time during this encounter. End Time 2:48pm  Vic Blackbird, MD

## 2019-04-23 ENCOUNTER — Encounter: Payer: Self-pay | Admitting: Family Medicine

## 2019-04-25 ENCOUNTER — Encounter: Payer: No Typology Code available for payment source | Admitting: Family Medicine

## 2019-06-13 ENCOUNTER — Other Ambulatory Visit: Payer: Self-pay | Admitting: Family Medicine

## 2019-06-13 NOTE — Telephone Encounter (Signed)
Ok to refill??  Last office visit 04/22/2019.  Last refill 03/18/2019, #2 refills.

## 2019-08-01 ENCOUNTER — Encounter: Payer: Self-pay | Admitting: Family Medicine

## 2019-08-01 ENCOUNTER — Other Ambulatory Visit: Payer: Self-pay

## 2019-08-01 ENCOUNTER — Telehealth: Payer: Self-pay | Admitting: *Deleted

## 2019-08-01 ENCOUNTER — Ambulatory Visit (INDEPENDENT_AMBULATORY_CARE_PROVIDER_SITE_OTHER): Payer: BC Managed Care – PPO | Admitting: Family Medicine

## 2019-08-01 VITALS — BP 112/64 | HR 56 | Temp 98.1°F | Resp 14 | Ht 68.0 in | Wt 166.0 lb

## 2019-08-01 DIAGNOSIS — E042 Nontoxic multinodular goiter: Secondary | ICD-10-CM | POA: Diagnosis not present

## 2019-08-01 DIAGNOSIS — D4989 Neoplasm of unspecified behavior of other specified sites: Secondary | ICD-10-CM

## 2019-08-01 DIAGNOSIS — F419 Anxiety disorder, unspecified: Secondary | ICD-10-CM

## 2019-08-01 DIAGNOSIS — F330 Major depressive disorder, recurrent, mild: Secondary | ICD-10-CM

## 2019-08-01 DIAGNOSIS — Z0000 Encounter for general adult medical examination without abnormal findings: Secondary | ICD-10-CM | POA: Diagnosis not present

## 2019-08-01 DIAGNOSIS — Z1231 Encounter for screening mammogram for malignant neoplasm of breast: Secondary | ICD-10-CM

## 2019-08-01 DIAGNOSIS — F5104 Psychophysiologic insomnia: Secondary | ICD-10-CM

## 2019-08-01 MED ORDER — ALPRAZOLAM 1 MG PO TABS: ORAL_TABLET | ORAL | 2 refills | Status: DC

## 2019-08-01 MED ORDER — TRAZODONE HCL 50 MG PO TABS: 25.0000 mg | ORAL_TABLET | Freq: Every evening | ORAL | 3 refills | Status: DC | PRN

## 2019-08-01 NOTE — Progress Notes (Signed)
Subjective:    Patient ID: Tracey Fox, female    DOB: 01-05-1956, 64 y.o.   MRN: [id number]  Patient presents for Annual Exam (is fasting) and Neoplasm (has mole like growth to forehead)  PT here for CPE , medications and history reviewed She has a mole like leision on her forehead that has been increasing in size She has been under more stress- she sold the family house due to pressure from her siblings for money. She had been living in her mothers home for 5 years. Now staying with daughter for the past few months  She has not been sleeping as well and request something for sleep She feels like a failure for letting the house go  She still takes xanax in the evening for anxiety   History of multinodular thyroid goiter, due for thyroid studies, feels like hair is shedding   Weight up 6lb lbs, no change in diet   Mammogram due- last in  2017   Colon cancer screening overdue  Immunizations- TDAP UTD, declines flu shot   Review Of Systems:  GEN- denies fatigue, fever, weight loss,weakness, recent illness HEENT- denies eye drainage, change in vision, nasal discharge, CVS- denies chest pain, palpitations RESP- denies SOB, cough, wheeze ABD- denies N/V, change in stools, abd pain GU- denies dysuria, hematuria, dribbling, incontinence MSK- denies joint pain, muscle aches, injury Neuro- denies headache, dizziness, syncope, seizure activity       Objective:    BP 112/64   Pulse (!) 56   Temp 98.1 F (36.7 C) (Temporal)   Resp 14   Ht 5\' 8"  (1.727 m)   Wt 166 lb (75.3 kg)   SpO2 94%   BMI 25.24 kg/m  GEN- NAD, alert and oriented x3 HEENT- PERRL, EOMI, non injected sclera, pink conjunctiva, MMM, oropharynx clear Neck- Supple, mild  thyromegaly CVS- RRR, no murmur RESP-CTAB ABD-NABS,soft,NT,ND Psych- anxious appearing, not depressed, no SI, well groomed Skin- nevus center of forehead just below hairline EXT- No edema Pulses- Radial, DP- 2+         Assessment & Plan:      Problem List Items Addressed This Visit      Unprioritized   Anxiety - Primary    Long standing history of anxiety with some depressive symptoms Trial of trazodone at bedtime prn Declines SSRI again Continue prn xanax      Relevant Medications   traZODone (DESYREL) 50 MG tablet   ALPRAZolam (XANAX) 1 MG tablet   Chronic insomnia   Major depression (HCC)   Relevant Medications   traZODone (DESYREL) 50 MG tablet   ALPRAZolam (XANAX) 1 MG tablet   Multinodular goiter    Check TSH      Relevant Orders   TSH (Completed)   T3, free (Completed)   T4, free (Completed)    Other Visit Diagnoses    Neoplasm of face       Relevant Orders   Ambulatory referral to Dermatology   Routine general medical examination at a health care facility       CPE done, pt to schedule mammogram, labs done today , cologaurd for colon cancer screen ing    Relevant Orders   CBC with Differential/Platelet (Completed)   Comprehensive metabolic panel (Completed)   Lipid panel (Completed)   Encounter for screening mammogram for malignant neoplasm of breast       Relevant Orders   MM 3D SCREEN BREAST BILATERAL      Note: This dictation was prepared with  Dragon dictation along with smaller Company secretary. Any transcriptional errors that result from this process are unintentional.

## 2019-08-01 NOTE — Telephone Encounter (Signed)
-----   Message from Alycia Rossetti, MD sent at 08/01/2019 11:27 AM EDT ----- Regarding: Please order cologuard

## 2019-08-01 NOTE — Patient Instructions (Addendum)
Referral to dermatology We will call with lab results Try the trazodone at bedtime, staart with half a tablet  Schedule your mammogram F/U 6 months

## 2019-08-01 NOTE — Telephone Encounter (Signed)
Received verbal orders for Cologuard.   Order placed via [employer].   Cologuard (Order [id number])

## 2019-08-02 LAB — CBC WITH DIFFERENTIAL/PLATELET
Absolute Monocytes: 656 cells/uL (ref 200–950)
Basophils Absolute: 51 cells/uL (ref 0–200)
Basophils Relative: 0.9 %
Eosinophils Absolute: 148 cells/uL (ref 15–500)
Eosinophils Relative: 2.6 %
HCT: 44 % (ref 35.0–45.0)
Hemoglobin: 14.8 g/dL (ref 11.7–15.5)
Lymphs Abs: 1892 cells/uL (ref 850–3900)
MCH: 30.3 pg (ref 27.0–33.0)
MCHC: 33.6 g/dL (ref 32.0–36.0)
MCV: 90 fL (ref 80.0–100.0)
MPV: 10.1 fL (ref 7.5–12.5)
Monocytes Relative: 11.5 %
Neutro Abs: 2953 cells/uL (ref 1500–7800)
Neutrophils Relative %: 51.8 %
Platelets: 285 10*3/uL (ref 140–400)
RBC: 4.89 10*6/uL (ref 3.80–5.10)
RDW: 11.2 % (ref 11.0–15.0)
Total Lymphocyte: 33.2 %
WBC: 5.7 10*3/uL (ref 3.8–10.8)

## 2019-08-02 LAB — COMPREHENSIVE METABOLIC PANEL
AG Ratio: 1.7 (calc) (ref 1.0–2.5)
ALT: 21 U/L (ref 6–29)
AST: 20 U/L (ref 10–35)
Albumin: 4.3 g/dL (ref 3.6–5.1)
Alkaline phosphatase (APISO): 115 U/L (ref 37–153)
BUN: 9 mg/dL (ref 7–25)
CO2: 25 mmol/L (ref 20–32)
Calcium: 9.5 mg/dL (ref 8.6–10.4)
Chloride: 106 mmol/L (ref 98–110)
Creat: 0.61 mg/dL (ref 0.50–0.99)
Globulin: 2.6 g/dL (calc) (ref 1.9–3.7)
Glucose, Bld: 89 mg/dL (ref 65–99)
Potassium: 4.3 mmol/L (ref 3.5–5.3)
Sodium: 140 mmol/L (ref 135–146)
Total Bilirubin: 1.1 mg/dL (ref 0.2–1.2)
Total Protein: 6.9 g/dL (ref 6.1–8.1)

## 2019-08-02 LAB — LIPID PANEL
Cholesterol: 182 mg/dL (ref <200)
HDL: 56 mg/dL (ref >=50)
LDL Cholesterol (Calc): 112 mg/dL (calc) — ABNORMAL HIGH
Non-HDL Cholesterol (Calc): 126 mg/dL (calc) (ref <130)
Total CHOL/HDL Ratio: 3.3 (calc) (ref <5.0)
Triglycerides: 57 mg/dL (ref <150)

## 2019-08-02 LAB — T4, FREE: Free T4: 1.2 ng/dL (ref 0.8–1.8)

## 2019-08-02 LAB — TSH: TSH: 0.22 mIU/L — ABNORMAL LOW (ref 0.40–4.50)

## 2019-08-02 LAB — T3, FREE: T3, Free: 3.7 pg/mL (ref 2.3–4.2)

## 2019-08-03 NOTE — Assessment & Plan Note (Signed)
Check TSH 

## 2019-08-03 NOTE — Assessment & Plan Note (Signed)
Long standing history of anxiety with some depressive symptoms Trial of trazodone at bedtime prn Declines SSRI again Continue prn xanax

## 2019-08-04 ENCOUNTER — Encounter: Payer: Self-pay | Admitting: *Deleted

## 2019-08-04 ENCOUNTER — Other Ambulatory Visit: Payer: Self-pay | Admitting: *Deleted

## 2019-08-04 DIAGNOSIS — E042 Nontoxic multinodular goiter: Secondary | ICD-10-CM

## 2019-08-04 NOTE — Telephone Encounter (Signed)
-----   Message from Alycia Rossetti, MD sent at 08/03/2019  8:27 AM EDT ----- Regarding: cologuard testing needed

## 2019-08-04 NOTE — Telephone Encounter (Signed)
This encounter was created in error - please disregard.

## 2019-08-08 DIAGNOSIS — Z20822 Contact with and (suspected) exposure to covid-19: Secondary | ICD-10-CM | POA: Diagnosis not present

## 2019-08-08 DIAGNOSIS — R062 Wheezing: Secondary | ICD-10-CM | POA: Diagnosis not present

## 2019-08-08 DIAGNOSIS — R05 Cough: Secondary | ICD-10-CM | POA: Diagnosis not present

## 2019-08-15 DIAGNOSIS — C44319 Basal cell carcinoma of skin of other parts of face: Secondary | ICD-10-CM | POA: Diagnosis not present

## 2019-08-15 DIAGNOSIS — D485 Neoplasm of uncertain behavior of skin: Secondary | ICD-10-CM | POA: Diagnosis not present

## 2019-08-18 ENCOUNTER — Emergency Department (HOSPITAL_COMMUNITY)
Admission: EM | Admit: 2019-08-18 | Discharge: 2019-08-19 | Disposition: A | Discharge status: Home / Self Care | Payer: BC Managed Care – PPO | Attending: Emergency Medicine | Admitting: Emergency Medicine

## 2019-08-18 ENCOUNTER — Other Ambulatory Visit: Payer: Self-pay

## 2019-08-18 ENCOUNTER — Encounter (HOSPITAL_COMMUNITY): Payer: Self-pay

## 2019-08-18 DIAGNOSIS — N390 Urinary tract infection, site not specified: Secondary | ICD-10-CM | POA: Insufficient documentation

## 2019-08-18 DIAGNOSIS — K5790 Diverticulosis of intestine, part unspecified, without perforation or abscess without bleeding: Secondary | ICD-10-CM | POA: Diagnosis not present

## 2019-08-18 DIAGNOSIS — R6883 Chills (without fever): Secondary | ICD-10-CM | POA: Diagnosis not present

## 2019-08-18 DIAGNOSIS — R61 Generalized hyperhidrosis: Secondary | ICD-10-CM | POA: Diagnosis not present

## 2019-08-18 DIAGNOSIS — R197 Diarrhea, unspecified: Secondary | ICD-10-CM | POA: Diagnosis not present

## 2019-08-18 DIAGNOSIS — R5383 Other fatigue: Secondary | ICD-10-CM | POA: Diagnosis not present

## 2019-08-18 DIAGNOSIS — R1032 Left lower quadrant pain: Secondary | ICD-10-CM | POA: Diagnosis not present

## 2019-08-18 DIAGNOSIS — R002 Palpitations: Secondary | ICD-10-CM | POA: Diagnosis not present

## 2019-08-18 DIAGNOSIS — R319 Hematuria, unspecified: Secondary | ICD-10-CM | POA: Diagnosis not present

## 2019-08-18 DIAGNOSIS — R7989 Other specified abnormal findings of blood chemistry: Secondary | ICD-10-CM | POA: Insufficient documentation

## 2019-08-18 DIAGNOSIS — R001 Bradycardia, unspecified: Secondary | ICD-10-CM | POA: Diagnosis not present

## 2019-08-18 DIAGNOSIS — R946 Abnormal results of thyroid function studies: Secondary | ICD-10-CM | POA: Diagnosis not present

## 2019-08-18 LAB — BASIC METABOLIC PANEL
Anion gap: 9 (ref 5–15)
BUN: 22 mg/dL (ref 8–23)
CO2: 24 mmol/L (ref 22–32)
Calcium: 9.3 mg/dL (ref 8.9–10.3)
Chloride: 105 mmol/L (ref 98–111)
Creatinine, Ser: 0.74 mg/dL (ref 0.44–1.00)
GFR calc Af Amer: >60 mL/min (ref >60)
GFR calc non Af Amer: >60 mL/min (ref >60)
Glucose, Bld: 106 mg/dL — ABNORMAL HIGH (ref 70–99)
Potassium: 3.6 mmol/L (ref 3.5–5.1)
Sodium: 138 mmol/L (ref 135–145)

## 2019-08-18 LAB — CBC WITH DIFFERENTIAL/PLATELET
Abs Immature Granulocytes: 0.08 10*3/uL — ABNORMAL HIGH (ref 0.00–0.07)
Basophils Absolute: 0 10*3/uL (ref 0.0–0.1)
Basophils Relative: 0 %
Eosinophils Absolute: 0.2 10*3/uL (ref 0.0–0.5)
Eosinophils Relative: 1 %
HCT: 44.1 % (ref 36.0–46.0)
Hemoglobin: 14.3 g/dL (ref 12.0–15.0)
Immature Granulocytes: 1 %
Lymphocytes Relative: 25 %
Lymphs Abs: 3.1 10*3/uL (ref 0.7–4.0)
MCH: 30.1 pg (ref 26.0–34.0)
MCHC: 32.4 g/dL (ref 30.0–36.0)
MCV: 92.8 fL (ref 80.0–100.0)
Monocytes Absolute: 0.8 10*3/uL (ref 0.1–1.0)
Monocytes Relative: 7 %
Neutro Abs: 8.2 10*3/uL — ABNORMAL HIGH (ref 1.7–7.7)
Neutrophils Relative %: 66 %
Platelets: 310 10*3/uL (ref 150–400)
RBC: 4.75 MIL/uL (ref 3.87–5.11)
RDW: 11.9 % (ref 11.5–15.5)
WBC: 12.4 10*3/uL — ABNORMAL HIGH (ref 4.0–10.5)
nRBC: 0 % (ref 0.0–0.2)

## 2019-08-18 LAB — URINALYSIS, ROUTINE W REFLEX MICROSCOPIC
Bilirubin Urine: NEGATIVE
Glucose, UA: NEGATIVE mg/dL
Ketones, ur: NEGATIVE mg/dL
Nitrite: NEGATIVE
Protein, ur: NEGATIVE mg/dL
Specific Gravity, Urine: 1.024 (ref 1.005–1.030)
WBC, UA: >50 WBC/hpf — ABNORMAL HIGH (ref 0–5)
pH: 5 (ref 5.0–8.0)

## 2019-08-18 LAB — TSH: TSH: 0.315 u[IU]/mL — ABNORMAL LOW (ref 0.350–4.500)

## 2019-08-18 NOTE — ED Triage Notes (Addendum)
Pt arrives to ED w/ c/o L sided flank pain, pt reports 10/10 pain. Pt endorses hematuria. Pt sent by PCP for concern for thyroid storm. Pt also endorses watery diarrhea, sweating, SOB, palpitations, and intermittent confusion.

## 2019-08-19 ENCOUNTER — Emergency Department (HOSPITAL_COMMUNITY): Payer: BC Managed Care – PPO

## 2019-08-19 DIAGNOSIS — K5790 Diverticulosis of intestine, part unspecified, without perforation or abscess without bleeding: Secondary | ICD-10-CM | POA: Diagnosis not present

## 2019-08-19 DIAGNOSIS — R319 Hematuria, unspecified: Secondary | ICD-10-CM | POA: Diagnosis not present

## 2019-08-19 LAB — T4, FREE: Free T4: 0.95 ng/dL (ref 0.61–1.12)

## 2019-08-19 MED ORDER — SODIUM CHLORIDE 0.9 % IV BOLUS
1000.0000 mL | Freq: Once | INTRAVENOUS | Status: AC
  Administered 2019-08-19: 1000 mL via INTRAVENOUS

## 2019-08-19 MED ORDER — CEPHALEXIN 500 MG PO CAPS: 500.0000 mg | ORAL_CAPSULE | Freq: Two times a day (BID) | ORAL | 0 refills | Status: AC

## 2019-08-19 MED ORDER — HYDROMORPHONE HCL 1 MG/ML IJ SOLN
0.5000 mg | Freq: Once | INTRAMUSCULAR | Status: AC
  Administered 2019-08-19: 09:00:00 0.5 mg via INTRAVENOUS
  Filled 2019-08-19: qty 1

## 2019-08-19 MED ORDER — IOHEXOL 300 MG/ML  SOLN
100.0000 mL | Freq: Once | INTRAMUSCULAR | Status: AC | PRN
  Administered 2019-08-19: 10:00:00 100 mL via INTRAVENOUS

## 2019-08-19 MED ORDER — ONDANSETRON HCL 4 MG/2ML IJ SOLN
4.0000 mg | Freq: Once | INTRAMUSCULAR | Status: AC
  Administered 2019-08-19: 09:00:00 4 mg via INTRAVENOUS
  Filled 2019-08-19: qty 2

## 2019-08-19 NOTE — ED Notes (Signed)
Patient verbalizes understanding of discharge instructions. Opportunity for questioning and answers were provided. Pt discharged from ED. 

## 2019-08-19 NOTE — ED Notes (Signed)
Pt ambulatory with steady gait 

## 2019-08-19 NOTE — ED Provider Notes (Signed)
[facility] EMERGENCY DEPARTMENT Provider Note   CSN: [id number] Arrival date & time: 08/18/19  1953     History Chief Complaint  Patient presents with  . Flank Pain    Tracey Fox is a 64 y.o. female.  64yo female with complaint of diarrhea x 3 days with left side back pain, fatigue, and feeling sluggish ans sweaty. Patient reports stools are dark brown/loose, non bloody, denies nausea, vomiting, CP, SHOB, fever, dysuria/hematuria/frequency. States her urine looks bubbly. Patient reports history of goiter, has recently seen PCP and was told labs were abnormal, plan to return for further lab testing. Patient takes xanax for anxiety, no other PMH. Prior abdominal surgeries include hysterectomy.         Past Medical History:  Diagnosis Date  . Anxiety   . Depression   . GERD (gastroesophageal reflux disease)   . Lung cancer [facility])    Brother    Patient Active Problem List   Diagnosis Date Noted  . Chronic insomnia 08/01/2019  . Multinodular goiter 10/18/2017  . Upper airway cough syndrome 09/26/2017  . Esophageal dysphagia 11/09/2016  . Infected cyst of skin 12/10/2013  . Back pain 12/10/2013  . Major depression ([place]) 07/06/2013  . Atypical chest pain 02/16/2013  . Family history of MI (myocardial infarction) 02/16/2013  . Dysphagia 12/05/2011  . Anxiety 12/05/2011  . GERD (gastroesophageal reflux disease) 12/05/2011  . Pulmonary nodules 12/05/2011  . Abnormal CT scan, chest 12/03/2011    Past Surgical History:  Procedure Laterality Date  . ABDOMINAL HYSTERECTOMY    . BIOPSY  02/22/2017   Procedure: BIOPSY;  Surgeon: Rogene Houston, MD;  Location: [facility];  Service: Endoscopy;;  esophagus  . BREAST SURGERY    . CHOLECYSTECTOMY    . ESOPHAGEAL DILATION N/A 02/22/2017   Procedure: ESOPHAGEAL DILATION;  Surgeon: Rogene Houston, MD;  Location: [facility] END[facility];  Service: Endoscopy;  Laterality: N/A;  . ESOPHAGOGASTRODUODENOSCOPY N/A  02/22/2017   Procedure: ESOPHAGOGASTRODUODENOSCOPY (EGD);  Surgeon: Rogene Houston, MD;  Location: [facility];  Service: Endoscopy;  Laterality: N/A;  1:55     OB History   No obstetric history on file.     Family History  Problem Relation Age of Onset  . Heart disease Father   . Breast cancer Sister   . Cancer Sister   . COPD Brother        oldest brother  . Lung cancer Brother   . Thyroid disease Neg Hx     Social History   Tobacco Use  . Smoking status: Never Smoker  . Smokeless tobacco: Never Used  Substance Use Topics  . Alcohol use: Yes    Comment: occ  . Drug use: No    Home Medications Prior to Admission medications   Medication Sig Start Date End Date Taking? Authorizing Provider  albuterol (VENTOLIN HFA) 108 (90 Base) MCG/ACT inhaler Inhale 1-2 puffs into the lungs every 6 (six) hours as needed for shortness of breath or wheezing. 08/15/19  Yes [provider]  ALPRAZolam (XANAX) 1 MG tablet TAKE 1 TABLET(1 MG) BY MOUTH TWICE DAILY AS NEEDED Patient taking differently: Take 1 mg by mouth at bedtime as needed for anxiety or sleep.  08/01/19  Yes [place], Modena Nunnery, MD  dexamethasone (DECADRON) 6 MG tablet Take 6 mg by mouth daily. 08/08/19  Yes [provider]  traZODone (DESYREL) 50 MG tablet Take 0.5-1 tablets (25-50 mg total) by mouth at bedtime as needed for  sleep. 08/01/19  Yes [place], Modena Nunnery, MD  azithromycin (ZITHROMAX) 250 MG tablet Take 1-2 tablets by mouth See admin instructions. Take 2 tablets on day 1, then take 1 tablet for 4 days. 08/08/19   [provider]  cephALEXin (KEFLEX) 500 MG capsule Take 1 capsule (500 mg total) by mouth 2 (two) times daily for 5 days. 08/19/19 08/24/19  Tacy Learn, PA-C    Allergies    Morphine  Review of Systems   Review of Systems  Constitutional: Positive for diaphoresis and fatigue. Negative for fever.  Respiratory: Negative for shortness of breath.   Cardiovascular: Negative for  chest pain.  Gastrointestinal: Positive for abdominal pain and diarrhea. Negative for blood in stool, constipation, nausea and vomiting.       Describes hyperactive bowels more so than pain  Genitourinary: Negative for dysuria, frequency and urgency.  Musculoskeletal: Positive for back pain.  Skin: Negative for rash and wound.  Allergic/Immunologic: Negative for immunocompromised state.  Neurological: Negative for weakness.  Hematological: Negative for adenopathy.  Psychiatric/Behavioral: Negative for confusion.  All other systems reviewed and are negative.   Physical Exam Updated Vital Signs BP (!) 134/53   Pulse (!) 58   Temp 98.1 F (36.7 C) (Oral)   Resp 15   SpO2 98%   Physical Exam Vitals and nursing note reviewed.  Constitutional:      General: She is not in acute distress.    Appearance: She is well-developed. She is not diaphoretic.  HENT:     Head: Normocephalic and atraumatic.  Cardiovascular:     Rate and Rhythm: Regular rhythm. Bradycardia present.     Pulses: Normal pulses.     Heart sounds: Normal heart sounds.  Pulmonary:     Effort: Pulmonary effort is normal.     Breath sounds: Normal breath sounds.  Abdominal:     Palpations: Abdomen is soft.     Tenderness: There is abdominal tenderness. There is no right CVA tenderness or left CVA tenderness.  Musculoskeletal:     Cervical back: Neck supple.     Right lower leg: No edema.     Left lower leg: No edema.  Skin:    General: Skin is warm and dry.  Neurological:     Mental Status: She is alert and oriented to person, place, and time.  Psychiatric:        Behavior: Behavior normal.     ED Results / Procedures / Treatments   Labs (all labs ordered are listed, but only abnormal results are displayed) Labs Reviewed  URINALYSIS, ROUTINE W REFLEX MICROSCOPIC - Abnormal; Notable for the following components:      Result Value   APPearance HAZY (*)    Hgb urine dipstick MODERATE (*)    Leukocytes,Ua  LARGE (*)    WBC, UA >50 (*)    Bacteria, UA FEW (*)    Non Squamous Epithelial 0-5 (*)    All other components within normal limits  CBC WITH DIFFERENTIAL/PLATELET - Abnormal; Notable for the following components:   WBC 12.4 (*)    Neutro Abs 8.2 (*)    Abs Immature Granulocytes 0.08 (*)    All other components within normal limits  BASIC METABOLIC PANEL - Abnormal; Notable for the following components:   Glucose, Bld 106 (*)    All other components within normal limits  TSH - Abnormal; Notable for the following components:   TSH 0.315 (*)    All other components within normal limits  T4, FREE    EKG None  Radiology CT Abdomen Pelvis W Contrast  Result Date: 08/19/2019 CLINICAL DATA:  Left-sided flank pain with hematuria EXAM: CT ABDOMEN AND PELVIS WITH CONTRAST TECHNIQUE: Multidetector CT imaging of the abdomen and pelvis was performed using the standard protocol following bolus administration of intravenous contrast. CONTRAST:  174mL OMNIPAQUE IOHEXOL 300 MG/ML  SOLN COMPARISON:  05/21/2018 FINDINGS: Lower chest: Lung bases are well aerated without focal infiltrate or sizable effusion. Mild scarring is noted in the left base. Hepatobiliary: Fatty infiltration of the liver is noted. The gallbladder has been surgically removed. Pancreas: Unremarkable. No pancreatic ductal dilatation or surrounding inflammatory changes. Spleen: Normal in size without focal abnormality. Adrenals/Urinary Tract: Adrenal glands are within normal limits. Kidneys demonstrate no renal calculi or obstructive changes. Mildly prominent extrarenal pelvis is noted on the right but stable. Small cyst is noted in the left kidney also stable in appearance. The bladder is decompressed. Stomach/Bowel: Diverticular change of the colon is noted without evidence of diverticulitis the appendix is not well visualized although no inflammatory changes are identified to suggest appendicitis. No small bowel abnormality is noted. Small  sliding-type hiatal hernia is seen. Vascular/Lymphatic: No significant vascular findings are present. No enlarged abdominal or pelvic lymph nodes. Reproductive: Status post hysterectomy. No adnexal masses. Other: No abdominal wall hernia or abnormality. No abdominopelvic ascites. Musculoskeletal: Mild degenerative changes of the lumbar spine are noted. IMPRESSION: Diverticulosis without diverticulitis. Fatty liver. No acute abnormality is noted. Electronically Signed   By: Inez Catalina M.D.   On: 08/19/2019 10:31    Procedures Procedures (including critical care time)  Medications Ordered in ED Medications  ondansetron (ZOFRAN) injection 4 mg (4 mg Intravenous Given 08/19/19 0910)  sodium chloride 0.9 % bolus 1,000 mL (0 mLs Intravenous Stopped 08/19/19 1043)  HYDROmorphone (DILAUDID) injection 0.5 mg (0.5 mg Intravenous Given 08/19/19 0912)  iohexol (OMNIPAQUE) 300 MG/ML solution 100 mL (100 mLs Intravenous Contrast Given 08/19/19 1021)    ED Course  I have reviewed the triage vital signs and the nursing notes.  Pertinent labs & imaging results that were available during my care of the patient were reviewed by me and considered in my medical decision making (see chart for details).  Clinical Course as of Aug 18 1056  Tue Apr 06, 773  1130 64 year old female with complaint of left flank pain and diarrhea for the past 3 days.  Also reports feeling tired and sweaty at times.  Patient is told her PCP that she may have a thyroid condition, has a known goiter, awaiting further work-up with PCP. On exam, has generalized abdominal tenderness, is otherwise well appearing. Consider diverticulitis, pyelonephritis, appendicitis. Labs reviewed, CBC with mild leukocytosis with white count of 12.4 with increase in neutrophils.  Urinalysis with evidence of urinary tract infection including large leukocytes, greater than 50 white cells with few bacteria, denies urinary symptoms.  BMP without significant findings.   TSH is low at 0.315 with a normal free T4.  CT abdomen pelvis shows diverticuli without diverticulitis, appendix not visualized however no evidence of appendicitis otherwise. Patient feeling somewhat better after Dilaudid and fluids.  Plan is to discharge on Keflex for her urinary tract infection with plan to follow-up with PCP for further work-up of her thyroid disorder and recheck.  Patient to return to ER for new or worsening symptoms.   [LM]    Clinical Course User Index [LM] Roque Lias   MDM Rules/Calculators/A&P  MDM Number of Diagnoses or Management Options Abnormal TSH: new, needed workup Diarrhea, unspecified type: new, no workup Diverticulosis: new, no workup Urinary tract infection in female: new, no workup   Amount and/or Complexity of Data Reviewed Clinical lab tests: ordered and reviewed Tests in the radiology section of CPT: ordered and reviewed Decide to obtain previous medical records or to obtain history from someone other than the patient: no Obtain history from someone other than the patient: no Review and summarize past medical records: yes Discuss the patient with other providers: no Independent visualization of images, tracings, or specimens: no  Patient Progress Patient progress: improved   Final Clinical Impression(s) / ED Diagnoses Final diagnoses:  Urinary tract infection in female  Diarrhea, unspecified type  Diverticulosis  Abnormal TSH    Rx / DC Orders ED Discharge Orders         Ordered    cephALEXin (KEFLEX) 500 MG capsule  2 times daily     08/19/19 1043           Tacy Learn, PA-C 08/19/19 1058    Noemi Chapel, MD 08/20/19 [phone]

## 2019-08-19 NOTE — ED Provider Notes (Signed)
This patient is a 64 year old female, she presents with diarrhea, left-sided back pain, she has had an abnormal appearance of her urine which she states is "bubbly".  She does have a history of pyelonephritis in the past.  On exam the patient has a soft abdomen which is tender in the infraumbilical area, no upper abdominal tenderness or CVA tenderness.  Lungs are clear, heart is regular, no edema.  Labs are unusual, she has possible pyelonephritis, CT scan pending to further evaluate for the cause of her back pain.  The diarrhea is a little concern given the big clinical picture, she does not look overtly dehydrated.  Urinalysis has specific gravity of 1.024, no ketones, thyroid function seems to be normal.  CBC with mild leukocytosis, no anemia and metabolic panel without renal dysfunction.  Medications given for comfort, follow-up CT scan and disposition accordingly.  Medical screening examination/treatment/procedure(s) were conducted as a shared visit with non-physician practitioner(s) and myself.  I personally evaluated the patient during the encounter.  Clinical Impression:   Final diagnoses:  Urinary tract infection in female  Diarrhea, unspecified type  Diverticulosis  Abnormal TSH         Noemi Chapel, MD 08/20/19 [phone]

## 2019-08-19 NOTE — Discharge Instructions (Addendum)
Take Keflex as prescribed and complete the full course. You may take Imodium or Pepto-Bismol for your diarrhea. Follow-up with your primary care provider for further work-up of your thyroid condition. Return to the emergency room for worsening or concerning symptoms.

## 2019-08-22 ENCOUNTER — Other Ambulatory Visit: Payer: Self-pay

## 2019-08-22 ENCOUNTER — Other Ambulatory Visit: Payer: BC Managed Care – PPO

## 2019-08-22 DIAGNOSIS — E042 Nontoxic multinodular goiter: Secondary | ICD-10-CM

## 2019-08-23 LAB — TSH: TSH: 0.78 m[IU]/L (ref 0.40–4.50)

## 2019-09-30 ENCOUNTER — Other Ambulatory Visit: Payer: Self-pay | Admitting: Family Medicine

## 2019-09-30 NOTE — Telephone Encounter (Signed)
Ok to refill??  Last office visit/ refill 08/01/2019.

## 2019-11-18 DIAGNOSIS — L02212 Cutaneous abscess of back [any part, except buttock]: Secondary | ICD-10-CM | POA: Diagnosis not present

## 2019-11-19 DIAGNOSIS — L02212 Cutaneous abscess of back [any part, except buttock]: Secondary | ICD-10-CM | POA: Diagnosis not present

## 2020-01-03 ENCOUNTER — Other Ambulatory Visit: Payer: Self-pay | Admitting: Family Medicine

## 2020-01-06 NOTE — Telephone Encounter (Signed)
Ok to refill??  Last office visit 08/01/2019.  Last refill 09/30/2019, #2 refills.

## 2020-02-06 ENCOUNTER — Ambulatory Visit: Payer: BC Managed Care – PPO | Admitting: Family Medicine

## 2020-02-13 ENCOUNTER — Telehealth: Payer: Self-pay | Admitting: *Deleted

## 2020-02-13 ENCOUNTER — Other Ambulatory Visit: Payer: Self-pay | Admitting: Oncology

## 2020-02-13 DIAGNOSIS — U071 COVID-19: Secondary | ICD-10-CM

## 2020-02-13 MED ORDER — GUAIFENESIN-CODEINE 100-10 MG/5ML PO SOLN: 5.0000 mL | Freq: Four times a day (QID) | ORAL | 0 refills | Status: DC | PRN

## 2020-02-13 NOTE — Telephone Encounter (Signed)
Call placed to patient and patient made aware.   Message sent to [place]

## 2020-02-13 NOTE — Telephone Encounter (Signed)
Received call from patient [phone]~ telephone.   Reports that she tested positive for COVID on 02/12/2020 with a rapid test. States that she has cough, fever, loss of taste and smell, fatigue, malaise.  Requested cough medication as OTC meds are not effective. Please advise.  Ok to send to [facility]?

## 2020-02-13 NOTE — Telephone Encounter (Signed)
Robitussin codiene sent to pharmacy [facility] to send orders, I am not sure she has any qualifying diagnosis for the infusion but we can try

## 2020-02-13 NOTE — Progress Notes (Signed)
I connected by phone with  Mrs. Zetino to discuss the potential use of an new treatment for mild to moderate COVID-19 viral infection in non-hospitalized patients.   This patient is a age/sex that meets the FDA criteria for Emergency Use Authorization of casirivimab\imdevimab.  Has a (+) direct SARS-CoV-2 viral test result 1. Has mild or moderate COVID-19  2. Is ? 64 years of age and weighs ? 40 kg 3. Is NOT hospitalized due to COVID-19 4. Is NOT requiring oxygen therapy or requiring an increase in baseline oxygen flow rate due to COVID-19 5. Is within 10 days of symptom onset 6. Has at least one of the high risk factor(s) for progression to severe COVID-19 and/or hospitalization as defined in EUA. Specific high risk criteria :  Past Medical History:  Diagnosis Date   Anxiety    Depression    GERD (gastroesophageal reflux disease)    Lung cancer (HCC)    Brother  ?  ?    Symptom onset  02/07/20   I have spoken and communicated the following to the patient or parent/caregiver:   1. FDA has authorized the emergency use of casirivimab\imdevimab for the treatment of mild to moderate COVID-19 in adults and pediatric patients with positive results of direct SARS-CoV-2 viral testing who are 32 years of age and older weighing at least 40 kg, and who are at high risk for progressing to severe COVID-19 and/or hospitalization.   2. The significant known and potential risks and benefits of casirivimab\imdevimab, and the extent to which such potential risks and benefits are unknown.   3. Information on available alternative treatments and the risks and benefits of those alternatives, including clinical trials.   4. Patients treated with casirivimab\imdevimab should continue to self-isolate and use infection control measures (e.g., wear mask, isolate, social distance, avoid sharing personal items, clean and disinfect high touch surfaces, and frequent handwashing) according to CDC guidelines.     5. The patient or parent/caregiver has the option to accept or refuse casirivimab\imdevimab .   After reviewing this information with the patient, The patient agreed to proceed with receiving casirivimab\imdevimab infusion and will be provided a copy of the Fact sheet prior to receiving the infusion.Rulon Abide, AGNP-C [phone] ([place])

## 2020-02-14 ENCOUNTER — Ambulatory Visit (HOSPITAL_COMMUNITY)
Admission: RE | Admit: 2020-02-14 | Discharge: 2020-02-14 | Disposition: A | Discharge status: Home / Self Care | Payer: BC Managed Care – PPO | Source: Ambulatory Visit | Attending: Pulmonary Disease | Admitting: Pulmonary Disease

## 2020-02-14 DIAGNOSIS — U071 COVID-19: Secondary | ICD-10-CM | POA: Insufficient documentation

## 2020-02-14 MED ORDER — SODIUM CHLORIDE 0.9 % IV SOLN: INTRAVENOUS | Status: DC | PRN

## 2020-02-14 MED ORDER — DIPHENHYDRAMINE HCL 50 MG/ML IJ SOLN: 50.0000 mg | Freq: Once | INTRAMUSCULAR | Status: DC | PRN

## 2020-02-14 MED ORDER — FAMOTIDINE IN NACL 20-0.9 MG/50ML-% IV SOLN: 20.0000 mg | Freq: Once | INTRAVENOUS | Status: DC | PRN

## 2020-02-14 MED ORDER — SODIUM CHLORIDE 0.9 % IV SOLN
1200.0000 mg | Freq: Once | INTRAVENOUS | Status: AC
  Administered 2020-02-14: 1200 mg via INTRAVENOUS

## 2020-02-14 MED ORDER — METHYLPREDNISOLONE SODIUM SUCC 125 MG IJ SOLR: 125.0000 mg | Freq: Once | INTRAMUSCULAR | Status: DC | PRN

## 2020-02-14 MED ORDER — ALBUTEROL SULFATE HFA 108 (90 BASE) MCG/ACT IN AERS: 2.0000 | INHALATION_SPRAY | Freq: Once | RESPIRATORY_TRACT | Status: DC | PRN

## 2020-02-14 MED ORDER — EPINEPHRINE 0.3 MG/0.3ML IJ SOAJ: 0.3000 mg | Freq: Once | INTRAMUSCULAR | Status: DC | PRN

## 2020-02-14 NOTE — Discharge Instructions (Signed)
10 Things You Can Do to Manage Your COVID-19 Symptoms at Home If you have possible or confirmed COVID-19: 1. Stay home from work and school. And stay away from other public places. If you must go out, avoid using any kind of public transportation, ridesharing, or taxis. 2. Monitor your symptoms carefully. If your symptoms get worse, call your healthcare provider immediately. 3. Get rest and stay hydrated. 4. If you have a medical appointment, call the healthcare provider ahead of time and tell them that you have or may have COVID-19. 5. For medical emergencies, call 911 and notify the dispatch personnel that you have or may have COVID-19. 6. Cover your cough and sneezes with a tissue or use the inside of your elbow. 7. Wash your hands often with soap and water for at least 20 seconds or clean your hands with an alcohol-based hand sanitizer that contains at least 60% alcohol. 8. As much as possible, stay in a specific room and away from other people in your home. Also, you should use a separate bathroom, if available. If you need to be around other people in or outside of the home, wear a mask. 9. Avoid sharing personal items with other people in your household, like dishes, towels, and bedding. 10. Clean all surfaces that are touched often, like counters, tabletops, and doorknobs. Use household cleaning sprays or wipes according to the label instructions. [email or web address] 11/13/2018 This information is not intended to replace advice given to you by your health care provider. Make sure you discuss any questions you have with your health care provider. Document Revised: 04/17/2019 Document Reviewed: 04/17/2019 Elsevier Patient Education  [place]. What types of side effects do monoclonal antibody drugs cause?  Common side effects  In general, the more common side effects caused by monoclonal antibody drugs include: . Allergic reactions, such as hives or itching . Flu-like signs and  symptoms, including chills, fatigue, fever, and muscle aches and pains . Nausea, vomiting . Diarrhea + . Skin rashes . Low blood pressure   The CDC is recommending patients who receive monoclonal antibody treatments wait at least 90 days before being vaccinated.  Currently, there are no data on the safety and efficacy of mRNA COVID-19 vaccines in persons who received monoclonal antibodies or convalescent plasma as part of COVID-19 treatment. Based on the estimated half-life of such therapies as well as evidence suggesting that reinfection is uncommon in the 90 days after initial infection, vaccination should be deferred for at least 90 days, as a precautionary measure until additional information becomes available, to avoid interference of the antibody treatment with vaccine-induced immune responses.  10 Things You Can Do to Manage Your COVID-19 Symptoms at Home If you have possible or confirmed COVID-19: 11. Stay home from work and school. And stay away from other public places. If you must go out, avoid using any kind of public transportation, ridesharing, or taxis. 12. Monitor your symptoms carefully. If your symptoms get worse, call your healthcare provider immediately. 13. Get rest and stay hydrated. 14. If you have a medical appointment, call the healthcare provider ahead of time and tell them that you have or may have COVID-19. 15. For medical emergencies, call 911 and notify the dispatch personnel that you have or may have COVID-19. 16. Cover your cough and sneezes with a tissue or use the inside of your elbow. 51. Wash your hands often with soap and water for at least 20 seconds or clean your hands with an alcohol-based hand  sanitizer that contains at least 60% alcohol. 18. As much as possible, stay in a specific room and away from other people in your home. Also, you should use a separate bathroom, if available. If you need to be around other people in or outside of the home, wear a  mask. 19. Avoid sharing personal items with other people in your household, like dishes, towels, and bedding. 20. Clean all surfaces that are touched often, like counters, tabletops, and doorknobs. Use household cleaning sprays or wipes according to the label instructions. [email or web address] 11/13/2018 This information is not intended to replace advice given to you by your health care provider. Make sure you discuss any questions you have with your health care provider. Document Revised: 04/17/2019 Document Reviewed: 04/17/2019 Elsevier Patient Education  [place].

## 2020-02-14 NOTE — Progress Notes (Signed)
  Diagnosis: COVID-19  Physician: Dr Joya Gaskins  Procedure: [facility] Med: casirivimab\imdevimab infusion - Provided patient with casirivimab\imdevimab fact sheet for patients, parents and caregivers prior to infusion.  Complications: No immediate complications noted.  Discharge: Discharged home   Lucinda Dell 02/14/2020

## 2020-02-16 ENCOUNTER — Other Ambulatory Visit: Payer: Self-pay | Admitting: Family Medicine

## 2020-02-16 MED ORDER — GUAIFENESIN-CODEINE 100-10 MG/5ML PO SOLN: 5.0000 mL | Freq: Four times a day (QID) | ORAL | 0 refills | Status: DC | PRN

## 2020-02-16 NOTE — Telephone Encounter (Signed)
Dr. Buelah Manis fax cough medication Rx to [facility] in [place] not able to filled need to go  [place], [address] AT [place]

## 2020-02-16 NOTE — Telephone Encounter (Signed)
Ok to re-send?

## 2020-02-17 ENCOUNTER — Other Ambulatory Visit (HOSPITAL_COMMUNITY): Payer: Self-pay

## 2020-02-25 ENCOUNTER — Other Ambulatory Visit: Payer: Self-pay

## 2020-02-25 ENCOUNTER — Ambulatory Visit (INDEPENDENT_AMBULATORY_CARE_PROVIDER_SITE_OTHER): Payer: BC Managed Care – PPO | Admitting: Family Medicine

## 2020-02-25 ENCOUNTER — Encounter: Payer: Self-pay | Admitting: Family Medicine

## 2020-02-25 VITALS — BP 106/62 | HR 79 | Temp 98.1°F | Resp 18

## 2020-02-25 DIAGNOSIS — F5104 Psychophysiologic insomnia: Secondary | ICD-10-CM

## 2020-02-25 DIAGNOSIS — F419 Anxiety disorder, unspecified: Secondary | ICD-10-CM | POA: Diagnosis not present

## 2020-02-25 DIAGNOSIS — E042 Nontoxic multinodular goiter: Secondary | ICD-10-CM

## 2020-02-25 DIAGNOSIS — F330 Major depressive disorder, recurrent, mild: Secondary | ICD-10-CM | POA: Diagnosis not present

## 2020-02-25 DIAGNOSIS — Z8616 Personal history of COVID-19: Secondary | ICD-10-CM

## 2020-02-25 MED ORDER — TRAZODONE HCL 100 MG PO TABS: 100.0000 mg | ORAL_TABLET | Freq: Every evening | ORAL | 6 refills | Status: DC | PRN

## 2020-02-25 MED ORDER — ALPRAZOLAM 1 MG PO TABS
ORAL_TABLET | ORAL | 2 refills | Status: DC
Start: 2020-02-25 — End: 2020-07-05

## 2020-02-25 NOTE — Assessment & Plan Note (Signed)
Increase prn trazodone to 100mg  at bedtime Continue xanax bid ,this has controlled symptoms

## 2020-02-25 NOTE — Progress Notes (Signed)
   Subjective:    Patient ID: Tracey Fox, female    DOB: 03-06-1956, 64 y.o.   MRN: [id number]  Patient presents for Follow-up and Medication Management    Pt was COVID positive End of Sept, had Infusion done 10/2, she has completed her qaurentine She does not having symptoms  She has not been vaccinated and declines doing so today  She did not have to use albuterol inhaler   GAD- she takes xanax twice a day, some days she takes trazadone  when she takes the trazodone I thought she cannot fall asleep.  Did not know the doses that you just.  She does take 50 mg when she takes the  medication.    Due for recheck on thyroid    Review Of Systems:  GEN- denies fatigue, fever, weight loss,weakness, recent illness HEENT- denies eye drainage, change in vision, nasal discharge, CVS- denies chest pain, palpitations RESP- denies SOB, cough, wheeze ABD- denies N/V, change in stools, abd pain GU- denies dysuria, hematuria, dribbling, incontinence MSK- denies joint pain, muscle aches, injury Neuro- denies headache, dizziness, syncope, seizure activity       Objective:    BP 106/62   Pulse 79   Temp 98.1 F (36.7 C)   Resp 18   SpO2 97%  GEN- NAD, alert and oriented x3 HEENT- PERRL, EOMI, non injected sclera, pink conjunctiva, MMM, oropharynx clear Neck- Supple, mild  thyromegaly CVS- RRR, no murmur RESP-CTAB ABD-NABS,soft,NT,ND Psych normal affect and mood  EXT- No edema Pulses- Radial, DP- 2+        Assessment & Plan:      Problem List Items Addressed This Visit      Unprioritized   Anxiety   Relevant Medications   traZODone (DESYREL) 100 MG tablet   ALPRAZolam (XANAX) 1 MG tablet   Chronic insomnia   Major depression (HCC)    Increase prn trazodone to 100mg  at bedtime Continue xanax bid ,this has controlled symptoms       Relevant Medications   traZODone (DESYREL) 100 MG tablet   ALPRAZolam (XANAX) 1 MG tablet   Multinodular goiter    Recheck TFT , no  symptoms       Relevant Orders   CBC with Differential/Platelet   Comprehensive metabolic panel   TSH   T3, free   T4, free    Other Visit Diagnoses    History of COVID-19    -  Primary   s/p infusion, completed quarentine, no symptoms,    Relevant Orders   CBC with Differential/Platelet   Comprehensive metabolic panel      Note: This dictation was prepared with Dragon dictation along with smaller phrase technology. Any transcriptional errors that result from this process are unintentional.

## 2020-02-25 NOTE — Assessment & Plan Note (Signed)
Recheck TFT , no symptoms

## 2020-02-25 NOTE — Patient Instructions (Signed)
F/U March/April for Physical

## 2020-02-26 LAB — COMPREHENSIVE METABOLIC PANEL
AG Ratio: 1.2 (calc) (ref 1.0–2.5)
ALT: 219 U/L — ABNORMAL HIGH (ref 6–29)
AST: 141 U/L — ABNORMAL HIGH (ref 10–35)
Albumin: 3.8 g/dL (ref 3.6–5.1)
Alkaline phosphatase (APISO): 216 U/L — ABNORMAL HIGH (ref 37–153)
BUN: 8 mg/dL (ref 7–25)
CO2: 26 mmol/L (ref 20–32)
Calcium: 9 mg/dL (ref 8.6–10.4)
Chloride: 106 mmol/L (ref 98–110)
Creat: 0.61 mg/dL (ref 0.50–0.99)
Globulin: 3.1 g/dL (calc) (ref 1.9–3.7)
Glucose, Bld: 92 mg/dL (ref 65–99)
Potassium: 4.4 mmol/L (ref 3.5–5.3)
Sodium: 139 mmol/L (ref 135–146)
Total Bilirubin: 1 mg/dL (ref 0.2–1.2)
Total Protein: 6.9 g/dL (ref 6.1–8.1)

## 2020-02-26 LAB — CBC WITH DIFFERENTIAL/PLATELET
Absolute Monocytes: 537 cells/uL (ref 200–950)
Basophils Absolute: 103 cells/uL (ref 0–200)
Basophils Relative: 1.3 %
Eosinophils Absolute: 87 cells/uL (ref 15–500)
Eosinophils Relative: 1.1 %
HCT: 42.7 % (ref 35.0–45.0)
Hemoglobin: 14.2 g/dL (ref 11.7–15.5)
Lymphs Abs: 5269 cells/uL — ABNORMAL HIGH (ref 850–3900)
MCH: 29.3 pg (ref 27.0–33.0)
MCHC: 33.3 g/dL (ref 32.0–36.0)
MCV: 88.2 fL (ref 80.0–100.0)
MPV: 10.2 fL (ref 7.5–12.5)
Monocytes Relative: 6.8 %
Neutro Abs: 1904 cells/uL (ref 1500–7800)
Neutrophils Relative %: 24.1 %
Platelets: 280 10*3/uL (ref 140–400)
RBC: 4.84 10*6/uL (ref 3.80–5.10)
RDW: 11.9 % (ref 11.0–15.0)
Total Lymphocyte: 66.7 %
WBC: 7.9 10*3/uL (ref 3.8–10.8)

## 2020-02-26 LAB — T3, FREE: T3, Free: 3.5 pg/mL (ref 2.3–4.2)

## 2020-02-26 LAB — TSH: TSH: 0.62 mIU/L (ref 0.40–4.50)

## 2020-02-26 LAB — T4, FREE: Free T4: 1 ng/dL (ref 0.8–1.8)

## 2020-02-27 ENCOUNTER — Other Ambulatory Visit: Payer: Self-pay | Admitting: *Deleted

## 2020-02-27 DIAGNOSIS — R7989 Other specified abnormal findings of blood chemistry: Secondary | ICD-10-CM

## 2020-03-01 ENCOUNTER — Other Ambulatory Visit: Payer: Self-pay

## 2020-03-01 ENCOUNTER — Other Ambulatory Visit: Payer: BC Managed Care – PPO

## 2020-03-01 DIAGNOSIS — R7989 Other specified abnormal findings of blood chemistry: Secondary | ICD-10-CM

## 2020-03-02 LAB — COMPLETE METABOLIC PANEL WITH GFR
AG Ratio: 1.3 (calc) (ref 1.0–2.5)
ALT: 205 U/L — ABNORMAL HIGH (ref 6–29)
AST: 127 U/L — ABNORMAL HIGH (ref 10–35)
Albumin: 4.1 g/dL (ref 3.6–5.1)
Alkaline phosphatase (APISO): 197 U/L — ABNORMAL HIGH (ref 37–153)
BUN: 9 mg/dL (ref 7–25)
CO2: 27 mmol/L (ref 20–32)
Calcium: 9.8 mg/dL (ref 8.6–10.4)
Chloride: 104 mmol/L (ref 98–110)
Creat: 0.65 mg/dL (ref 0.50–0.99)
GFR, Est African American: 111 mL/min/{1.73_m2} (ref >=60)
GFR, Est Non African American: 96 mL/min/{1.73_m2} (ref >=60)
Globulin: 3.2 g/dL (calc) (ref 1.9–3.7)
Glucose, Bld: 84 mg/dL (ref 65–99)
Potassium: 4.2 mmol/L (ref 3.5–5.3)
Sodium: 139 mmol/L (ref 135–146)
Total Bilirubin: 1.1 mg/dL (ref 0.2–1.2)
Total Protein: 7.3 g/dL (ref 6.1–8.1)

## 2020-03-24 ENCOUNTER — Other Ambulatory Visit: Payer: BC Managed Care – PPO

## 2020-03-24 ENCOUNTER — Other Ambulatory Visit: Payer: Self-pay

## 2020-03-24 DIAGNOSIS — R7989 Other specified abnormal findings of blood chemistry: Secondary | ICD-10-CM | POA: Diagnosis not present

## 2020-03-25 LAB — COMPLETE METABOLIC PANEL WITH GFR
AG Ratio: 1.6 (calc) (ref 1.0–2.5)
ALT: 85 U/L — ABNORMAL HIGH (ref 6–29)
AST: 49 U/L — ABNORMAL HIGH (ref 10–35)
Albumin: 4.2 g/dL (ref 3.6–5.1)
Alkaline phosphatase (APISO): 119 U/L (ref 37–153)
BUN: 13 mg/dL (ref 7–25)
CO2: 28 mmol/L (ref 20–32)
Calcium: 9.7 mg/dL (ref 8.6–10.4)
Chloride: 103 mmol/L (ref 98–110)
Creat: 0.77 mg/dL (ref 0.50–0.99)
GFR, Est African American: 96 mL/min/{1.73_m2} (ref >=60)
GFR, Est Non African American: 83 mL/min/{1.73_m2} (ref >=60)
Globulin: 2.7 g/dL (calc) (ref 1.9–3.7)
Glucose, Bld: 90 mg/dL (ref 65–99)
Potassium: 4.6 mmol/L (ref 3.5–5.3)
Sodium: 137 mmol/L (ref 135–146)
Total Bilirubin: 1.1 mg/dL (ref 0.2–1.2)
Total Protein: 6.9 g/dL (ref 6.1–8.1)

## 2020-03-25 LAB — HEPATITIS PANEL, ACUTE
Hep A IgM: NONREACTIVE
Hep B C IgM: NONREACTIVE
Hepatitis B Surface Ag: NONREACTIVE
Hepatitis C Ab: NONREACTIVE
SIGNAL TO CUT-OFF: 0.01 (ref <1.00)

## 2020-03-26 ENCOUNTER — Other Ambulatory Visit: Payer: Self-pay | Admitting: *Deleted

## 2020-03-26 DIAGNOSIS — R7989 Other specified abnormal findings of blood chemistry: Secondary | ICD-10-CM

## 2020-05-06 ENCOUNTER — Ambulatory Visit (INDEPENDENT_AMBULATORY_CARE_PROVIDER_SITE_OTHER): Payer: BC Managed Care – PPO | Admitting: Family Medicine

## 2020-05-06 ENCOUNTER — Other Ambulatory Visit: Payer: Self-pay

## 2020-05-06 VITALS — BP 110/74 | HR 60 | Temp 97.9°F | Ht 68.0 in | Wt 165.0 lb

## 2020-05-06 DIAGNOSIS — H6123 Impacted cerumen, bilateral: Secondary | ICD-10-CM | POA: Diagnosis not present

## 2020-05-06 DIAGNOSIS — R7989 Other specified abnormal findings of blood chemistry: Secondary | ICD-10-CM | POA: Diagnosis not present

## 2020-05-06 MED ORDER — ALBUTEROL SULFATE HFA 108 (90 BASE) MCG/ACT IN AERS
1.0000 | INHALATION_SPRAY | Freq: Four times a day (QID) | RESPIRATORY_TRACT | 3 refills | Status: AC | PRN
Start: 2020-05-06 — End: ?

## 2020-05-06 MED ORDER — NEOMYCIN-POLYMYXIN-HC 3.5-10000-1 OT SOLN: 3.0000 [drp] | Freq: Four times a day (QID) | OTIC | 0 refills | Status: DC

## 2020-05-06 NOTE — Progress Notes (Signed)
Subjective:    Patient ID: Tracey Fox, female    DOB: 1956/05/04, 64 y.o.   MRN: [id number]  HPI Both ears feel stopped up however her left ear is much worse.  She also reports pain and discomfort in her left ear.  On examination today, both auditory canals are completely impacted with wax.  I am unable to visualize the canal or the eardrum to evaluate for any presence of infection.  Also the patient had Covid earlier this year.  Her liver function test were in the 200s.  Her last test had trended much better.  She is due to recheck this today Past Medical History:  Diagnosis Date  . Anxiety   . Depression   . GERD (gastroesophageal reflux disease)   . Lung cancer ([place])    Brother   Past Surgical History:  Procedure Laterality Date  . ABDOMINAL HYSTERECTOMY    . BIOPSY  02/22/2017   Procedure: BIOPSY;  Surgeon: Rogene Houston, MD;  [facility];  Service: Endoscopy;;  esophagus  . BREAST SURGERY    . CHOLECYSTECTOMY    . ESOPHAGEAL DILATION N/A 02/22/2017   Procedure: ESOPHAGEAL DILATION;  Surgeon: Rogene Houston, MD;  [facility];  Service: Endoscopy;  Laterality: N/A;  . ESOPHAGOGASTRODUODENOSCOPY N/A 02/22/2017   Procedure: ESOPHAGOGASTRODUODENOSCOPY (EGD);  Surgeon: Rogene Houston, MD;  [facility];  Service: Endoscopy;  Laterality: N/A;  1:55   Current Outpatient Medications on File Prior to Visit  Medication Sig Dispense Refill  . ALPRAZolam (XANAX) 1 MG tablet TAKE 1 TABLET(1 MG) BY MOUTH TWICE DAILY AS NEEDED 60 tablet 2  . traZODone (DESYREL) 100 MG tablet Take 1 tablet (100 mg total) by mouth at bedtime as needed for sleep. 30 tablet 6   No current facility-administered medications on file prior to visit.   Allergies  Allergen Reactions  . Morphine Shortness Of Breath and Other (See Comments)    (difficulty breathing, shakiness, used NARCAN to reverse)   Social History   Socioeconomic History  . Marital status:  Divorced    Spouse name: Not on file  . Number of children: 4  . Years of education: Not on file  . Highest education level: Not on file  Occupational History  . Occupation: Glass blower/designer    Employer: [employer]  Tobacco Use  . Smoking status: Never Smoker  . Smokeless tobacco: Never Used  Vaping Use  . Vaping Use: Never used  Substance and Sexual Activity  . Alcohol use: Yes    Comment: occ  . Drug use: No  . Sexual activity: Not on file  Other Topics Concern  . Not on file  Social History Narrative  . Not on file   Social Determinants of Health   Financial Resource Strain: Not on file  Food Insecurity: Not on file  Transportation Needs: Not on file  Physical Activity: Not on file  Stress: Not on file  Social Connections: Not on file  Intimate Partner Violence: Not on file      Review of Systems  All other systems reviewed and are negative.      Objective:   Physical Exam Vitals reviewed.  Constitutional:      Appearance: Normal appearance. She is normal weight. She is not ill-appearing.  HENT:     Right Ear: There is impacted cerumen.     Left Ear: There is impacted cerumen.  Cardiovascular:     Rate and Rhythm: Normal rate and  regular rhythm.     Heart sounds: Normal heart sounds.  Pulmonary:     Effort: Pulmonary effort is normal.     Breath sounds: Normal breath sounds.           Assessment & Plan:  Elevated LFTs - Plan: COMPLETE METABOLIC PANEL WITH GFR  Bilateral impacted cerumen  Cerumen was removed easily with irrigation and lavage.  Right auditory canal is completely normal.  Left auditory canal is erythematous and irritated however I believe more of this was due to removing the wax then due to underlying swimmer's ear.  That being said I will give the patient Cortisporin HC otic, 3 drops 4 times daily for 3 or 4 days to calm the irritation.  I believe the wax was 99% of the problem however.  We will repeat a CMP today with the patient's here.

## 2020-05-07 LAB — COMPLETE METABOLIC PANEL WITH GFR
AG Ratio: 1.6 (calc) (ref 1.0–2.5)
ALT: 30 U/L — ABNORMAL HIGH (ref 6–29)
AST: 25 U/L (ref 10–35)
Albumin: 4.2 g/dL (ref 3.6–5.1)
Alkaline phosphatase (APISO): 99 U/L (ref 37–153)
BUN: 12 mg/dL (ref 7–25)
CO2: 27 mmol/L (ref 20–32)
Calcium: 9.4 mg/dL (ref 8.6–10.4)
Chloride: 105 mmol/L (ref 98–110)
Creat: 0.58 mg/dL (ref 0.50–0.99)
GFR, Est African American: 114 mL/min/{1.73_m2} (ref >=60)
GFR, Est Non African American: 99 mL/min/{1.73_m2} (ref >=60)
Globulin: 2.6 g/dL (calc) (ref 1.9–3.7)
Glucose, Bld: 85 mg/dL (ref 65–99)
Potassium: 4.2 mmol/L (ref 3.5–5.3)
Sodium: 139 mmol/L (ref 135–146)
Total Bilirubin: 1.6 mg/dL — ABNORMAL HIGH (ref 0.2–1.2)
Total Protein: 6.8 g/dL (ref 6.1–8.1)

## 2020-05-11 ENCOUNTER — Encounter: Payer: Self-pay | Admitting: *Deleted

## 2020-07-04 ENCOUNTER — Other Ambulatory Visit: Payer: Self-pay | Admitting: Family Medicine

## 2020-07-05 NOTE — Telephone Encounter (Signed)
They sent me this by accident.

## 2020-07-28 DIAGNOSIS — H04123 Dry eye syndrome of bilateral lacrimal glands: Secondary | ICD-10-CM | POA: Diagnosis not present

## 2020-07-28 DIAGNOSIS — H259 Unspecified age-related cataract: Secondary | ICD-10-CM | POA: Diagnosis not present

## 2020-09-02 DIAGNOSIS — Z01818 Encounter for other preprocedural examination: Secondary | ICD-10-CM | POA: Diagnosis not present

## 2020-09-02 DIAGNOSIS — H43812 Vitreous degeneration, left eye: Secondary | ICD-10-CM | POA: Diagnosis not present

## 2020-09-02 DIAGNOSIS — H2512 Age-related nuclear cataract, left eye: Secondary | ICD-10-CM | POA: Diagnosis not present

## 2020-09-13 DIAGNOSIS — H2512 Age-related nuclear cataract, left eye: Secondary | ICD-10-CM | POA: Diagnosis not present

## 2020-09-16 DIAGNOSIS — F411 Generalized anxiety disorder: Secondary | ICD-10-CM | POA: Diagnosis not present

## 2020-09-16 DIAGNOSIS — Z7689 Persons encountering health services in other specified circumstances: Secondary | ICD-10-CM | POA: Diagnosis not present

## 2020-09-16 DIAGNOSIS — H269 Unspecified cataract: Secondary | ICD-10-CM | POA: Diagnosis not present

## 2020-09-20 DIAGNOSIS — H2511 Age-related nuclear cataract, right eye: Secondary | ICD-10-CM | POA: Diagnosis not present

## 2020-09-30 ENCOUNTER — Other Ambulatory Visit: Payer: Self-pay | Admitting: Family Medicine

## 2021-01-13 DIAGNOSIS — Z0000 Encounter for general adult medical examination without abnormal findings: Secondary | ICD-10-CM | POA: Diagnosis not present

## 2021-01-20 ENCOUNTER — Other Ambulatory Visit (HOSPITAL_COMMUNITY): Payer: Self-pay | Admitting: Internal Medicine

## 2021-01-20 ENCOUNTER — Other Ambulatory Visit (HOSPITAL_COMMUNITY): Payer: Self-pay | Admitting: Family Medicine

## 2021-01-20 ENCOUNTER — Other Ambulatory Visit: Payer: Self-pay

## 2021-01-20 ENCOUNTER — Ambulatory Visit (HOSPITAL_COMMUNITY)
Admission: RE | Admit: 2021-01-20 | Discharge: 2021-01-20 | Disposition: A | Discharge status: Home / Self Care | Payer: BC Managed Care – PPO | Source: Ambulatory Visit | Attending: Internal Medicine | Admitting: Internal Medicine

## 2021-01-20 DIAGNOSIS — R0600 Dyspnea, unspecified: Secondary | ICD-10-CM | POA: Diagnosis not present

## 2021-01-20 DIAGNOSIS — M545 Low back pain, unspecified: Secondary | ICD-10-CM | POA: Diagnosis not present

## 2021-01-20 DIAGNOSIS — M519 Unspecified thoracic, thoracolumbar and lumbosacral intervertebral disc disorder: Secondary | ICD-10-CM

## 2021-01-20 DIAGNOSIS — R0609 Other forms of dyspnea: Secondary | ICD-10-CM

## 2021-01-20 DIAGNOSIS — H269 Unspecified cataract: Secondary | ICD-10-CM | POA: Diagnosis not present

## 2021-01-20 DIAGNOSIS — E785 Hyperlipidemia, unspecified: Secondary | ICD-10-CM | POA: Diagnosis not present

## 2021-01-20 DIAGNOSIS — F411 Generalized anxiety disorder: Secondary | ICD-10-CM | POA: Diagnosis not present

## 2021-02-02 DIAGNOSIS — N390 Urinary tract infection, site not specified: Secondary | ICD-10-CM | POA: Diagnosis not present

## 2021-09-01 DIAGNOSIS — E663 Overweight: Secondary | ICD-10-CM | POA: Diagnosis not present

## 2021-09-01 DIAGNOSIS — F411 Generalized anxiety disorder: Secondary | ICD-10-CM | POA: Diagnosis not present

## 2021-09-06 DIAGNOSIS — Z20822 Contact with and (suspected) exposure to covid-19: Secondary | ICD-10-CM | POA: Diagnosis not present

## 2021-09-06 DIAGNOSIS — R5383 Other fatigue: Secondary | ICD-10-CM | POA: Diagnosis not present

## 2021-09-06 DIAGNOSIS — R350 Frequency of micturition: Secondary | ICD-10-CM | POA: Diagnosis not present

## 2021-09-06 DIAGNOSIS — N3001 Acute cystitis with hematuria: Secondary | ICD-10-CM | POA: Diagnosis not present

## 2021-09-06 DIAGNOSIS — R197 Diarrhea, unspecified: Secondary | ICD-10-CM | POA: Diagnosis not present

## 2021-11-07 IMAGING — DX DG CHEST 2V
2 series · 2 of 2 positions shown · non-contrast
Comparison: 09/26/2017

CLINICAL DATA: Dyspnea on exertion x1 week

EXAM:
CHEST - 2 VIEW

[chest pa]
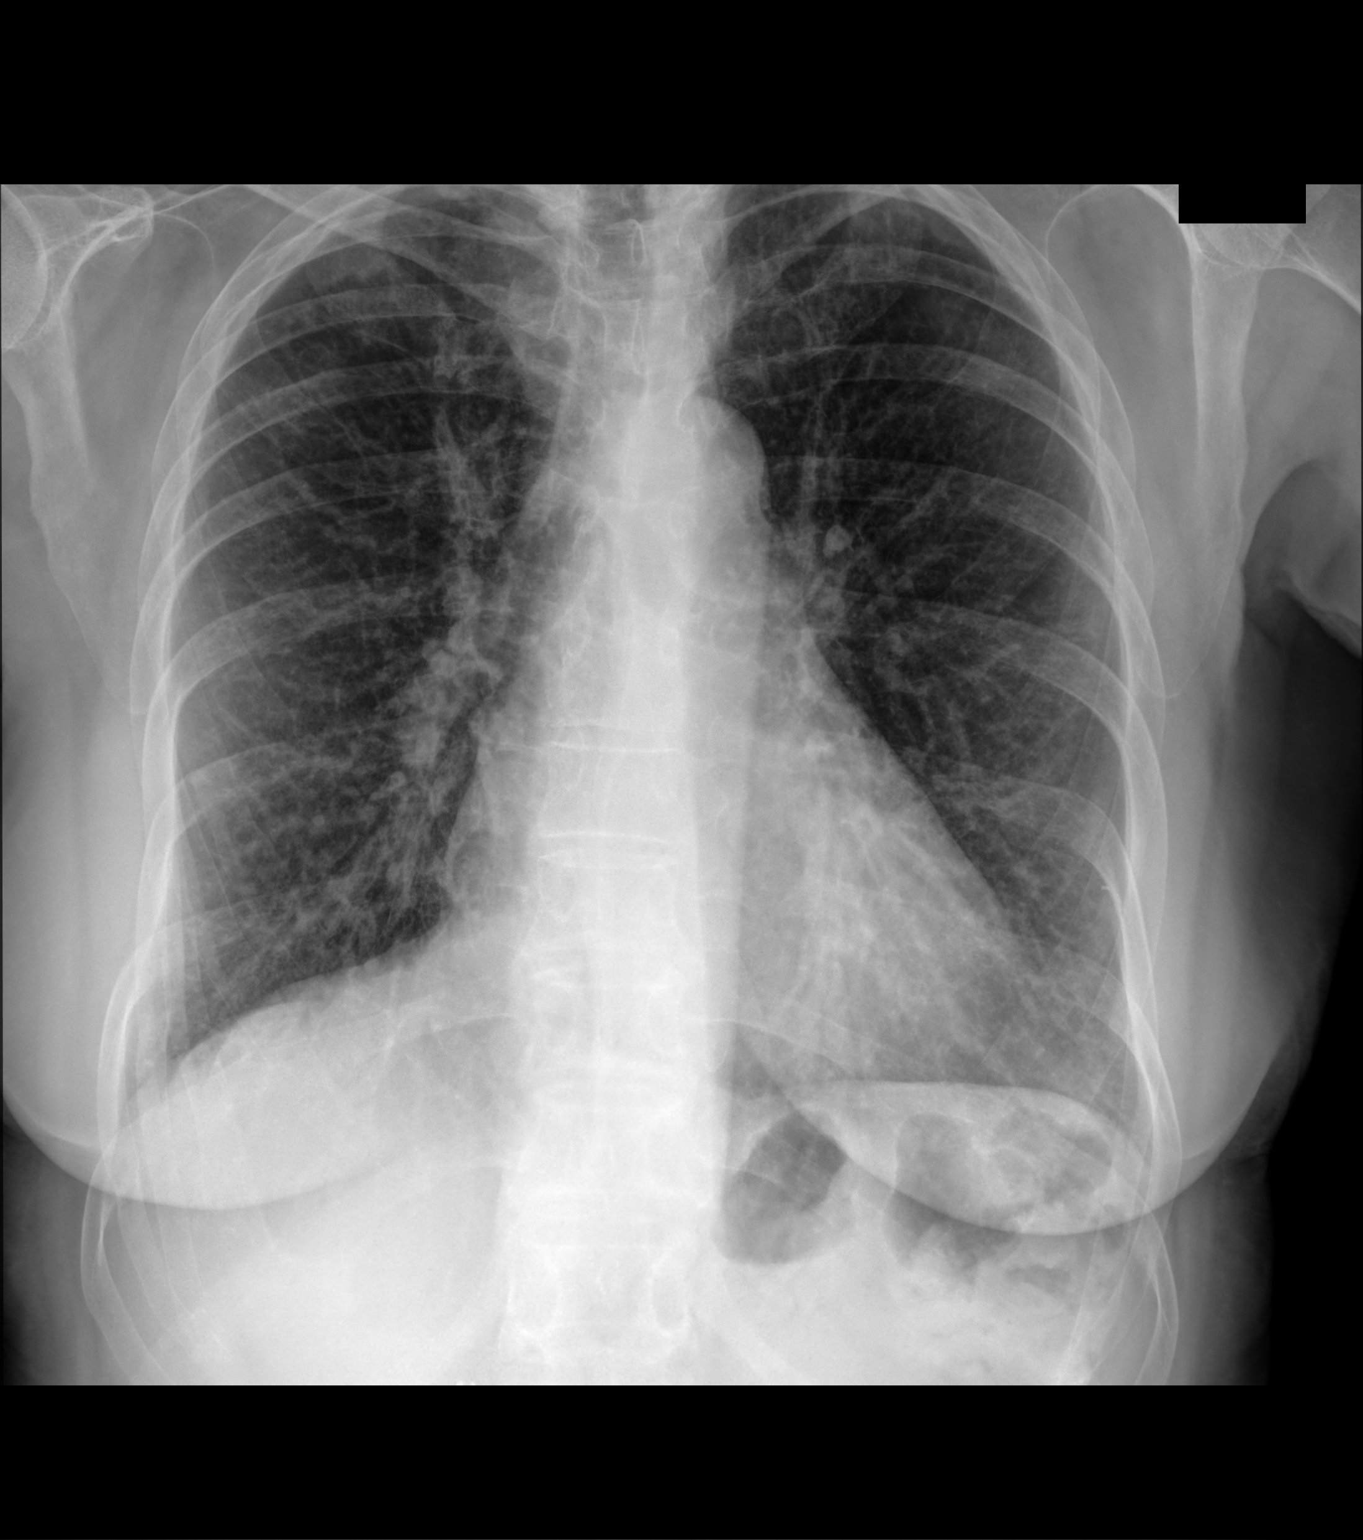

[chest lat]
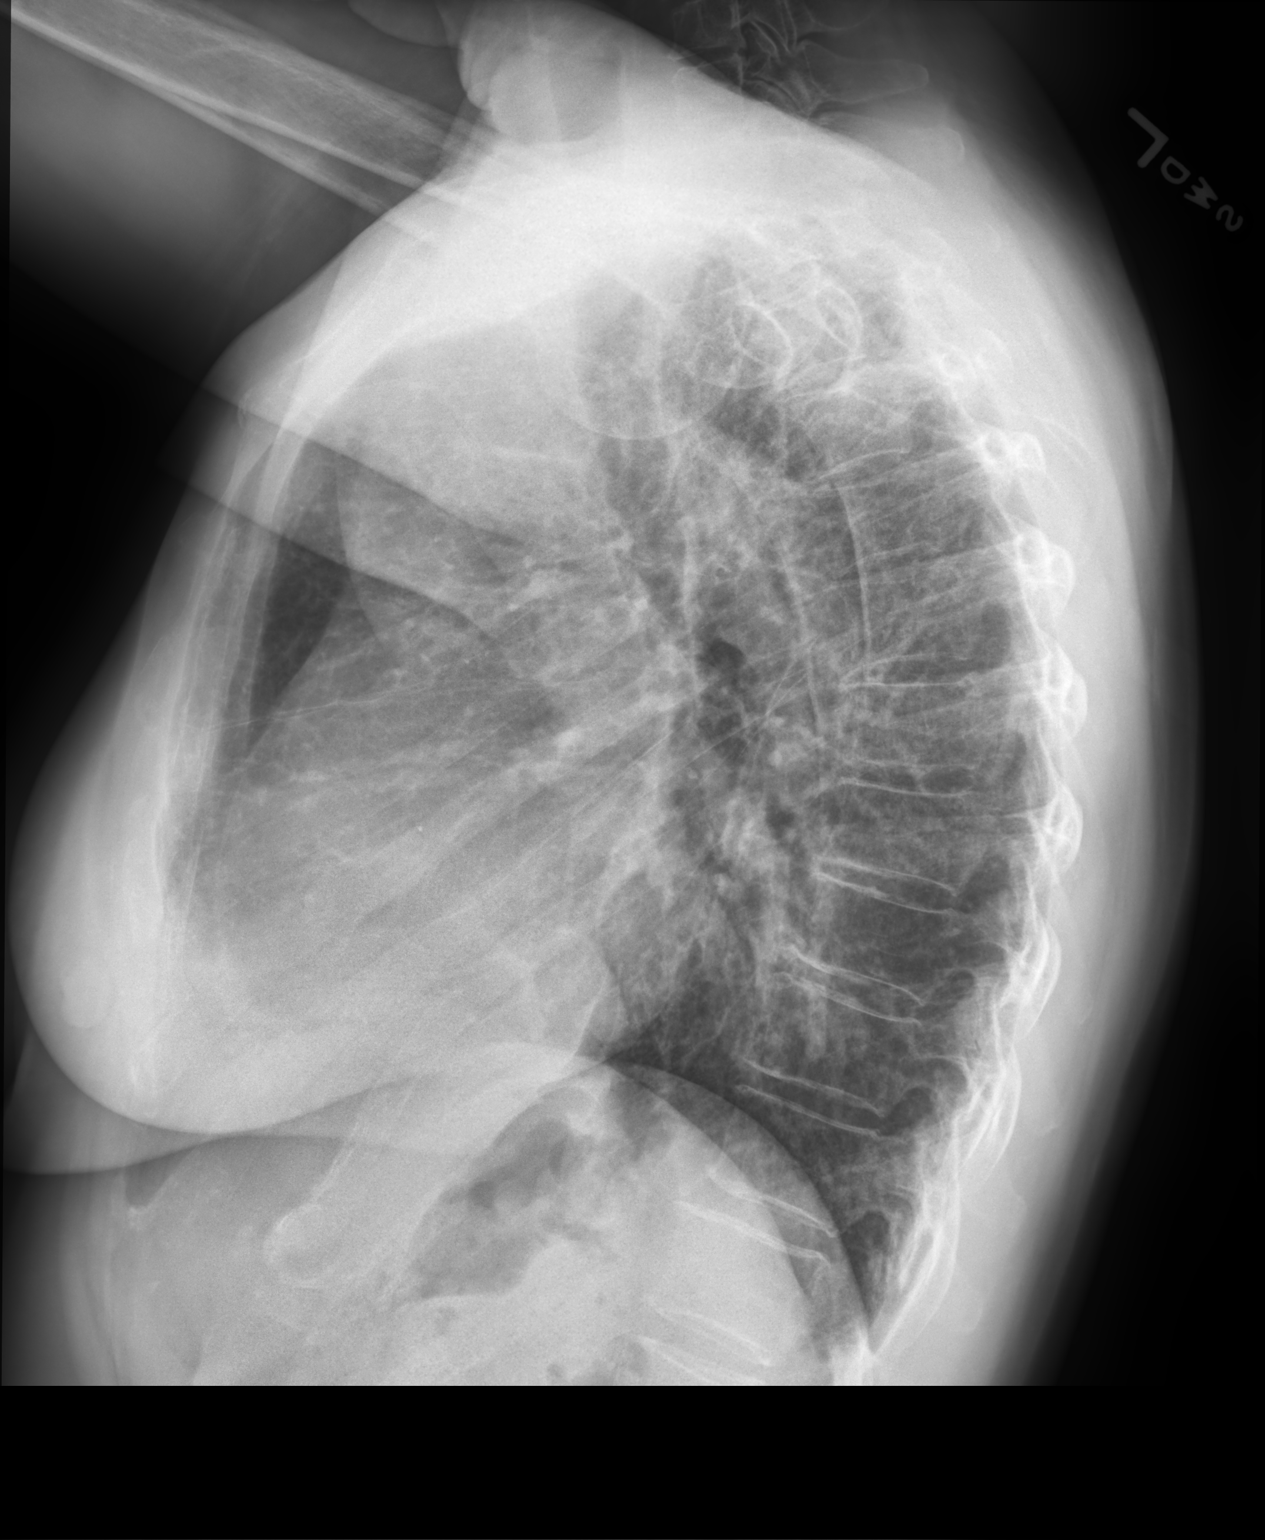

[2 of 2 positions shown; findings below may reference images not displayed]

FINDINGS: Lungs are clear.  No pleural effusion or pneumothorax.

The heart is normal in size.

Visualized osseous structures are within normal limits.

Cholecystectomy clips.
IMPRESSION: Normal chest radiographs.

## 2022-02-10 ENCOUNTER — Other Ambulatory Visit (HOSPITAL_COMMUNITY): Payer: Self-pay | Admitting: Internal Medicine

## 2022-02-10 DIAGNOSIS — Z1231 Encounter for screening mammogram for malignant neoplasm of breast: Secondary | ICD-10-CM

## 2022-05-15 HISTORY — PX: BIOPSY THYROID: PRO38

## 2022-07-19 ENCOUNTER — Ambulatory Visit (INDEPENDENT_AMBULATORY_CARE_PROVIDER_SITE_OTHER): Payer: Medicaid Other

## 2022-07-19 ENCOUNTER — Encounter: Payer: Self-pay | Admitting: Internal Medicine

## 2022-07-19 ENCOUNTER — Other Ambulatory Visit (INDEPENDENT_AMBULATORY_CARE_PROVIDER_SITE_OTHER): Payer: Medicaid Other

## 2022-07-19 ENCOUNTER — Ambulatory Visit: Payer: Medicaid Other | Admitting: Internal Medicine

## 2022-07-19 VITALS — BP 112/74 | HR 64 | Temp 97.9°F | Ht 68.0 in | Wt 181.8 lb

## 2022-07-19 DIAGNOSIS — R0609 Other forms of dyspnea: Secondary | ICD-10-CM | POA: Diagnosis not present

## 2022-07-19 LAB — BASIC METABOLIC PANEL
BUN: 12 mg/dL (ref 6–23)
CO2: 26 mEq/L (ref 19–32)
Calcium: 9.9 mg/dL (ref 8.4–10.5)
Chloride: 105 mEq/L (ref 96–112)
Creatinine, Ser: 0.64 mg/dL (ref 0.40–1.20)
GFR: 93.42 mL/min (ref >60.00)
Glucose, Bld: 92 mg/dL (ref 70–99)
Potassium: 3.9 mEq/L (ref 3.5–5.1)
Sodium: 139 mEq/L (ref 135–145)

## 2022-07-19 LAB — CBC WITH DIFFERENTIAL/PLATELET
Basophils Absolute: 0.1 10*3/uL (ref 0.0–0.1)
Basophils Relative: 0.9 % (ref 0.0–3.0)
Eosinophils Absolute: 0.1 10*3/uL (ref 0.0–0.7)
Eosinophils Relative: 1.1 % (ref 0.0–5.0)
HCT: 44.4 % (ref 36.0–46.0)
Hemoglobin: 15 g/dL (ref 12.0–15.0)
Lymphocytes Relative: 44.9 % (ref 12.0–46.0)
Lymphs Abs: 2.9 10*3/uL (ref 0.7–4.0)
MCHC: 33.7 g/dL (ref 30.0–36.0)
MCV: 89.9 fl (ref 78.0–100.0)
Monocytes Absolute: 0.5 10*3/uL (ref 0.1–1.0)
Monocytes Relative: 7.1 % (ref 3.0–12.0)
Neutro Abs: 3 10*3/uL (ref 1.4–7.7)
Neutrophils Relative %: 46 % (ref 43.0–77.0)
Platelets: 273 10*3/uL (ref 150.0–400.0)
RBC: 4.94 Mil/uL (ref 3.87–5.11)
RDW: 12.2 % (ref 11.5–15.5)
WBC: 6.6 10*3/uL (ref 4.0–10.5)

## 2022-07-19 LAB — BRAIN NATRIURETIC PEPTIDE: Pro B Natriuretic peptide (BNP): 79 pg/mL (ref 0.0–100.0)

## 2022-07-19 LAB — SEDIMENTATION RATE: Sed Rate: 11 mm/hr (ref 0–30)

## 2022-07-19 LAB — TSH: TSH: 0.26 u[IU]/mL — ABNORMAL LOW (ref 0.35–5.50)
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
## 2022-07-19 NOTE — Patient Instructions (Signed)
To get the most out of exercise, you need to be continuously aware that you are short of breath, but never out of breath, for at least 30 minutes daily. As you improve, it will actually be easier for you to do the same amount of exercise  in  30 minutes so always push to the level where you are short of breath.  Once you can do this, push for longer duration or repeat it after at least 4 hours of rest.  Make sure you check your oxygen saturations at highest level of activity    Please remember to go to the  x-ray department  for your tests - we will call you with the results when they are available    Please remember to go to the lab department at [address]   for your tests - we will call you with the results when they are available.   Pulmonary follow up will be as needed

## 2022-07-19 NOTE — Assessment & Plan Note (Addendum)
Onset with covid 02/2020 received infusion but not admitted - 07/19/2022   Walked on RA  x  3  lap(s) =  approx 750  ft  @ fast pace, stopped due to end of study with min sob  with lowest 02 sats 96%    Symptoms are markedly disproportionate to objective findings and not clear to what extent this is actually a pulmonary  problem but pt does appear to have difficult to sort out respiratory symptoms of unknown origin for which  DDX  = almost all start with A and  include Adherence, Ace Inhibitors, Acid Reflux, Active Sinus Disease, Alpha 1 Antitripsin deficiency, Anxiety masquerading as Airways dz,  ABPA,  Allergy(esp in young), Aspiration (esp in elderly), Adverse effects of meds,  Active smoking or Vaping, A bunch of PE's/clot burden (a few small clots can't cause this syndrome unless there is already severe underlying pulm or vascular dz with poor reserve),  Anemia or thyroid disorder, plus two Bs  = Bronchiectasis and Beta blocker use..and one C= CHF    Adherence is always the initial "prime suspect" and is a multilayered concern that requires a "trust but verify" approach in every patient - starting with knowing how to use medications, especially inhalers, correctly, keeping up with refills and understanding the fundamental difference between maintenance and prns vs those medications only taken for a very short course and then stopped and not refilled.   ? Anemia/ thyroid disorder;   Lab Results  Component Value Date   HGB 15.0 07/19/2022   HGB 14.2 02/25/2020   HGB 14.3 08/18/2019    Lab Results  Component Value Date   TSH 0.26 (L) 07/19/2022   >>> will need repeat tsh/ t3uptake, Total T4 and free T3   ? Anxiety/depression/ deconditioning  > usually at the bottom of this list of usual suspects but   note already on psychotropics and may interfere with adherence and also interpretation of response or lack thereof to symptom management which can be quite subjective.   ? A bunch of PEs  ? Chf  > no evidence by cxr o BNP   Rec Reconditioning ex and if not improving over next 6 weeks then schedule CPST next.   Marland Kitchen

## 2022-07-20 ENCOUNTER — Encounter: Payer: Self-pay | Admitting: Internal Medicine

## 2022-07-20 LAB — D-DIMER, QUANTITATIVE: D-Dimer, Quant: 0.91 mcg/mL FEU — ABNORMAL HIGH (ref <0.50)

## 2022-07-21 ENCOUNTER — Other Ambulatory Visit: Payer: Self-pay

## 2022-07-21 DIAGNOSIS — R0609 Other forms of dyspnea: Secondary | ICD-10-CM

## 2022-07-21 NOTE — Progress Notes (Signed)
Per Dr. Melvyn Novas: order  Total T4,  T3 uptake and Free T3 to complete the work up

## 2022-07-24 ENCOUNTER — Other Ambulatory Visit (INDEPENDENT_AMBULATORY_CARE_PROVIDER_SITE_OTHER): Payer: Medicaid Other

## 2022-07-24 DIAGNOSIS — R0609 Other forms of dyspnea: Secondary | ICD-10-CM

## 2022-07-24 LAB — T4
Free Thyroxine Index: 1.9 (ref 1.4–3.8)
T4, Total: 5.8 ug/dL (ref 5.1–11.9)

## 2022-07-24 LAB — T3, FREE: T3, Free: 3.6 pg/mL (ref 2.3–4.2)

## 2022-07-24 LAB — T3 UPTAKE: T3 Uptake: 33 % (ref 22–35)

## 2022-08-28 ENCOUNTER — Other Ambulatory Visit (HOSPITAL_COMMUNITY): Payer: Self-pay | Admitting: Internal Medicine

## 2022-08-28 DIAGNOSIS — Z1231 Encounter for screening mammogram for malignant neoplasm of breast: Secondary | ICD-10-CM

## 2022-08-28 DIAGNOSIS — R946 Abnormal results of thyroid function studies: Secondary | ICD-10-CM

## 2022-09-07 ENCOUNTER — Ambulatory Visit (HOSPITAL_COMMUNITY)
Admission: RE | Admit: 2022-09-07 | Discharge: 2022-09-07 | Disposition: A | Discharge status: Home / Self Care | Payer: Medicaid Other | Source: Ambulatory Visit | Attending: Internal Medicine | Admitting: Internal Medicine

## 2022-09-07 DIAGNOSIS — R946 Abnormal results of thyroid function studies: Secondary | ICD-10-CM | POA: Insufficient documentation

## 2022-09-07 DIAGNOSIS — Z1231 Encounter for screening mammogram for malignant neoplasm of breast: Secondary | ICD-10-CM

## 2022-09-13 ENCOUNTER — Other Ambulatory Visit (HOSPITAL_COMMUNITY): Payer: Self-pay | Admitting: Internal Medicine

## 2022-09-13 DIAGNOSIS — E042 Nontoxic multinodular goiter: Secondary | ICD-10-CM

## 2022-09-14 ENCOUNTER — Other Ambulatory Visit (HOSPITAL_COMMUNITY): Payer: Self-pay | Admitting: Internal Medicine

## 2022-09-14 DIAGNOSIS — E042 Nontoxic multinodular goiter: Secondary | ICD-10-CM

## 2022-09-20 ENCOUNTER — Encounter (HOSPITAL_COMMUNITY): Payer: Self-pay

## 2022-09-20 ENCOUNTER — Ambulatory Visit (HOSPITAL_COMMUNITY): Admission: RE | Admit: 2022-09-20 | Payer: Medicaid Other | Source: Ambulatory Visit

## 2022-10-13 ENCOUNTER — Ambulatory Visit
Admission: RE | Admit: 2022-10-13 | Discharge: 2022-10-13 | Disposition: A | Discharge status: Home / Self Care | Payer: Medicaid Other | Source: Ambulatory Visit | Attending: Internal Medicine | Admitting: Internal Medicine

## 2022-10-13 DIAGNOSIS — E042 Nontoxic multinodular goiter: Secondary | ICD-10-CM

## 2022-10-17 LAB — CYTOLOGY - NON PAP

## 2022-11-29 ENCOUNTER — Ambulatory Visit
Admission: EM | Admit: 2022-11-29 | Discharge: 2022-11-29 | Disposition: A | Discharge status: Home / Self Care | Payer: Medicaid Other | Attending: Internal Medicine | Admitting: Internal Medicine

## 2022-11-29 DIAGNOSIS — N3001 Acute cystitis with hematuria: Secondary | ICD-10-CM | POA: Diagnosis present

## 2022-11-29 DIAGNOSIS — R109 Unspecified abdominal pain: Secondary | ICD-10-CM | POA: Diagnosis not present

## 2022-11-29 LAB — POCT URINALYSIS DIP (MANUAL ENTRY)
Bilirubin, UA: NEGATIVE
Glucose, UA: NEGATIVE mg/dL
Ketones, POC UA: NEGATIVE mg/dL
Nitrite, UA: NEGATIVE
Protein Ur, POC: NEGATIVE mg/dL
Spec Grav, UA: 1.025 (ref 1.010–1.025)
Urobilinogen, UA: 1 E.U./dL
pH, UA: 7 (ref 5.0–8.0)

## 2022-11-29 MED ORDER — CEFTRIAXONE SODIUM 1 G IJ SOLR
1.0000 g | Freq: Once | INTRAMUSCULAR | Status: AC
  Administered 2022-11-29: 1 g via INTRAMUSCULAR

## 2022-11-29 MED ORDER — SULFAMETHOXAZOLE-TRIMETHOPRIM 800-160 MG PO TABS: 1.0000 | ORAL_TABLET | Freq: Two times a day (BID) | ORAL | 0 refills | Status: AC

## 2022-11-29 NOTE — ED Provider Notes (Signed)
[facility]    CSN: [id number] Arrival date & time: 11/29/22  1011      History   Chief Complaint No chief complaint on file.   HPI Tracey Fox is a 67 y.o. female.   Patient presents with right lower quadrant abdominal pain that radiates around to right lower back that started about 6 days ago.  Reports that she was having some left-sided back pain previously as well that is now resolved.  She was seen at a different urgent care when symptoms first started and prescribed Cipro given that she was told that she had a kidney infection.  States she was called a few days later and told to stop the Cipro and go to the ER for further evaluation given symptoms have been persistent.  She states that she did not go to the ER but instead came to this urgent care today for further evaluation.  Reports that she has felt fatigued and has been having some nausea without vomiting.  Denies any fever or chills.  Denies blood in urine.  Denies dysuria or urinary frequency. She has stopped taking the cipro.      Past Medical History:  Diagnosis Date   Anxiety    Depression    GERD (gastroesophageal reflux disease)    Lung cancer [facility])    Brother    Patient Active Problem List   Diagnosis Date Noted   Chronic insomnia 08/01/2019   Multinodular goiter 10/18/2017   Upper airway cough syndrome 09/26/2017   Esophageal dysphagia 11/09/2016   Back pain 12/10/2013   Major depression ([facility]) 07/06/2013   Family history of MI (myocardial infarction) 02/16/2013   Dysphagia 12/05/2011   Anxiety 12/05/2011   GERD (gastroesophageal reflux disease) 12/05/2011   Pulmonary nodules 12/05/2011   DOE (dyspnea on exertion) 12/03/2011    Past Surgical History:  Procedure Laterality Date   ABDOMINAL HYSTERECTOMY     BIOPSY  02/22/2017   Procedure: BIOPSY;  Surgeon: Malissa Hippo, MD;  [facility];  Service: Endoscopy;;  esophagus   BREAST SURGERY     CHOLECYSTECTOMY      ESOPHAGEAL DILATION N/A 02/22/2017   Procedure: ESOPHAGEAL DILATION;  Surgeon: Malissa Hippo, MD;  [facility];  Service: Endoscopy;  Laterality: N/A;   ESOPHAGOGASTRODUODENOSCOPY N/A 02/22/2017   Procedure: ESOPHAGOGASTRODUODENOSCOPY (EGD);  Surgeon: Malissa Hippo, MD;  [facility];  Service: Endoscopy;  Laterality: N/A;  1:55    OB History   No obstetric history on file.      Home Medications    Prior to Admission medications   Medication Sig Start Date End Date Taking? Authorizing Provider  sulfamethoxazole-trimethoprim (BACTRIM DS) 800-160 MG tablet Take 1 tablet by mouth 2 (two) times daily for 7 days. 11/29/22 12/06/22 Yes Arda Keadle, Acie Fredrickson, FNP  albuterol (VENTOLIN HFA) 108 (90 Base) MCG/ACT inhaler Inhale 1-2 puffs into the lungs every 6 (six) hours as needed for shortness of breath or wheezing. Patient not taking: Reported on 07/19/2022 05/06/20   Donita Brooks, MD  ALPRAZolam Prudy Feeler) 1 MG tablet TAKE 1 TABLET(1 MG) BY MOUTH TWICE DAILY AS NEEDED 07/05/20   Salley Scarlet, MD    Family History Family History  Problem Relation Age of Onset   Heart disease Father    Breast cancer Sister    Cancer Sister    COPD Brother        oldest brother   Lung cancer Brother    Thyroid disease  Neg Hx     Social History Social History   Tobacco Use   Smoking status: Never    Passive exposure: Current   Smokeless tobacco: Never  Vaping Use   Vaping status: Never Used  Substance Use Topics   Alcohol use: Yes    Comment: occ   Drug use: No     Allergies   Morphine   Review of Systems Review of Systems Per HPI  Physical Exam Triage Vital Signs ED Triage Vitals  Encounter Vitals Group     BP 11/29/22 1035 108/69     Systolic BP Percentile --      Diastolic BP Percentile --      Pulse Rate 11/29/22 1035 72     Resp 11/29/22 1035 18     Temp 11/29/22 1035 97.8 F (36.6 C)     Temp Source 11/29/22 1035 Oral     SpO2 11/29/22 1035 95  %     Weight --      Height --      Head Circumference --      Peak Flow --      Pain Score 11/29/22 1036 8     Pain Loc --      Pain Education --      Exclude from Growth Chart --    No data found.  Updated Vital Signs BP 108/69 (BP Location: Left Arm)   Pulse 72   Temp 97.8 F (36.6 C) (Oral)   Resp 18   SpO2 95%   Visual Acuity Right Eye Distance:   Left Eye Distance:   Bilateral Distance:    Right Eye Near:   Left Eye Near:    Bilateral Near:     Physical Exam Constitutional:      General: She is not in acute distress.    Appearance: Normal appearance. She is not toxic-appearing or diaphoretic.  HENT:     Head: Normocephalic and atraumatic.  Eyes:     Extraocular Movements: Extraocular movements intact.     Conjunctiva/sclera: Conjunctivae normal.  Cardiovascular:     Rate and Rhythm: Normal rate and regular rhythm.     Pulses: Normal pulses.     Heart sounds: Normal heart sounds.  Pulmonary:     Effort: Pulmonary effort is normal. No respiratory distress.     Breath sounds: Normal breath sounds. No stridor. No wheezing, rhonchi or rales.  Abdominal:     General: Bowel sounds are normal. There is no distension.     Palpations: Abdomen is soft.     Tenderness: There is abdominal tenderness in the right lower quadrant. There is no guarding or rebound.     Comments: Patient has some mild tenderness to palpation to right lower quadrant of abdomen.  Musculoskeletal:     Comments: Has some tenderness to palpation to right flank area.  No direct spinal tenderness, crepitus, step-off noted.  Neurological:     General: No focal deficit present.     Mental Status: She is alert and oriented to person, place, and time. Mental status is at baseline.     Deep Tendon Reflexes: Reflexes are normal and symmetric.  Psychiatric:        Mood and Affect: Mood normal.        Behavior: Behavior normal.        Thought Content: Thought content normal.        Judgment: Judgment  normal.      UC Treatments / Results  Labs (  all labs ordered are listed, but only abnormal results are displayed) Labs Reviewed  POCT URINALYSIS DIP (MANUAL ENTRY) - Abnormal; Notable for the following components:      Result Value   Clarity, UA hazy (*)    Blood, UA moderate (*)    Leukocytes, UA Large (3+) (*)    All other components within normal limits  URINE CULTURE  COMPREHENSIVE METABOLIC PANEL  CBC    EKG   Radiology No results found.  Procedures Procedures (including critical care time)  Medications Ordered in UC Medications  cefTRIAXone (ROCEPHIN) injection 1 g (1 g Intramuscular Given 11/29/22 1106)    Initial Impression / Assessment and Plan / UC Course  I have reviewed the triage vital signs and the nursing notes.  Pertinent labs & imaging results that were available during my care of the patient were reviewed by me and considered in my medical decision making (see chart for details).     Patient has large amount of white blood cells on UA.  Given associated symptoms, I am concerned for Pyelonephritis.  Will opt to treat with IM Rocephin and patient was sent Bactrim to the pharmacy.  Awaiting urine culture.  Given duration of symptoms, will obtain CMP and CBC to rule any worrisome complications of infection or any other etiologies.  Advised patient of strict ER precautions if symptoms persist or worsen.  Patient verbalized understanding and was agreeable with plan. Final Clinical Impressions(s) / UC Diagnoses   Final diagnoses:  Acute cystitis with hematuria  Flank pain     Discharge Instructions      I do think that you have a kidney infection.  You were given an antibiotic shot today in urgent care and sent antibiotic pills to the pharmacy.  Blood work is pending.  Will call if it is abnormal.  Please ensure that you are drinking plenty of water.  Go to the emergency department if symptoms persist or worsen.    ED Prescriptions     Medication  Sig Dispense Auth. Provider   sulfamethoxazole-trimethoprim (BACTRIM DS) 800-160 MG tablet Take 1 tablet by mouth 2 (two) times daily for 7 days. 14 tablet [place], Acie Fredrickson, [place]      PDMP not reviewed this encounter.   Gustavus Bryant, [place] 11/29/22 1113

## 2022-11-29 NOTE — ED Triage Notes (Signed)
Pt reports she is having lower right quadrant abdominal pain that is radiating to the lower right side of her back x 1 week. Stated she feels fatigue and no appetite.      States UC dxs her with a kidney infection then called her back and stated there was not bacteria present. Go to ED since sxs still persisted and stop cipro.

## 2022-11-29 NOTE — Discharge Instructions (Signed)
I do think that you have a kidney infection.  You were given an antibiotic shot today in urgent care and sent antibiotic pills to the pharmacy.  Blood work is pending.  Will call if it is abnormal.  Please ensure that you are drinking plenty of water.  Go to the emergency department if symptoms persist or worsen.

## 2022-11-30 LAB — COMPREHENSIVE METABOLIC PANEL
ALT: 27 IU/L (ref 0–32)
AST: 21 IU/L (ref 0–40)
Albumin: 4.1 g/dL (ref 3.9–4.9)
Alkaline Phosphatase: 143 IU/L — ABNORMAL HIGH (ref 44–121)
BUN/Creatinine Ratio: 25 (ref 12–28)
BUN: 15 mg/dL (ref 8–27)
Bilirubin Total: 1.5 mg/dL — ABNORMAL HIGH (ref 0.0–1.2)
CO2: 21 mmol/L (ref 20–29)
Calcium: 9.6 mg/dL (ref 8.7–10.3)
Chloride: 104 mmol/L (ref 96–106)
Creatinine, Ser: 0.59 mg/dL (ref 0.57–1.00)
Globulin, Total: 2.6 g/dL (ref 1.5–4.5)
Glucose: 117 mg/dL — ABNORMAL HIGH (ref 70–99)
Potassium: 4.3 mmol/L (ref 3.5–5.2)
Sodium: 139 mmol/L (ref 134–144)
Total Protein: 6.7 g/dL (ref 6.0–8.5)
eGFR: 101 mL/min/{1.73_m2} (ref >59)

## 2022-11-30 LAB — CBC
Hematocrit: 44.7 % (ref 34.0–46.6)
Hemoglobin: 15.1 g/dL (ref 11.1–15.9)
MCH: 30 pg (ref 26.6–33.0)
MCHC: 33.8 g/dL (ref 31.5–35.7)
MCV: 89 fL (ref 79–97)
Platelets: 268 10*3/uL (ref 150–450)
RBC: 5.03 x10E6/uL (ref 3.77–5.28)
RDW: 12.2 % (ref 11.7–15.4)
WBC: 6.5 10*3/uL (ref 3.4–10.8)

## 2022-12-01 LAB — URINE CULTURE: Culture: 30000 — AB

## 2022-12-04 ENCOUNTER — Other Ambulatory Visit: Payer: Self-pay | Admitting: Family Medicine

## 2022-12-04 DIAGNOSIS — R109 Unspecified abdominal pain: Secondary | ICD-10-CM

## 2022-12-26 ENCOUNTER — Other Ambulatory Visit: Payer: Medicaid Other

## 2023-01-02 ENCOUNTER — Other Ambulatory Visit: Payer: Self-pay

## 2023-02-28 DIAGNOSIS — R7401 Elevation of levels of liver transaminase levels: Secondary | ICD-10-CM | POA: Diagnosis not present

## 2023-02-28 DIAGNOSIS — E663 Overweight: Secondary | ICD-10-CM | POA: Diagnosis not present

## 2023-02-28 DIAGNOSIS — E785 Hyperlipidemia, unspecified: Secondary | ICD-10-CM | POA: Diagnosis not present

## 2023-02-28 DIAGNOSIS — F411 Generalized anxiety disorder: Secondary | ICD-10-CM | POA: Diagnosis not present

## 2023-02-28 DIAGNOSIS — H269 Unspecified cataract: Secondary | ICD-10-CM | POA: Diagnosis not present

## 2023-02-28 DIAGNOSIS — R946 Abnormal results of thyroid function studies: Secondary | ICD-10-CM | POA: Diagnosis not present

## 2023-02-28 DIAGNOSIS — R0609 Other forms of dyspnea: Secondary | ICD-10-CM | POA: Diagnosis not present

## 2023-02-28 DIAGNOSIS — K7689 Other specified diseases of liver: Secondary | ICD-10-CM | POA: Diagnosis not present

## 2023-02-28 DIAGNOSIS — M51369 Other intervertebral disc degeneration, lumbar region without mention of lumbar back pain or lower extremity pain: Secondary | ICD-10-CM | POA: Diagnosis not present

## 2023-02-28 DIAGNOSIS — E870 Hyperosmolality and hypernatremia: Secondary | ICD-10-CM | POA: Diagnosis not present

## 2023-02-28 DIAGNOSIS — Z9849 Cataract extraction status, unspecified eye: Secondary | ICD-10-CM | POA: Diagnosis not present

## 2023-02-28 DIAGNOSIS — Z0001 Encounter for general adult medical examination with abnormal findings: Secondary | ICD-10-CM | POA: Diagnosis not present

## 2023-02-28 DIAGNOSIS — M519 Unspecified thoracic, thoracolumbar and lumbosacral intervertebral disc disorder: Secondary | ICD-10-CM | POA: Diagnosis not present

## 2023-06-08 IMAGING — DX DG CHEST 2V
2 series · 2 of 2 positions shown · non-contrast
Comparison: 04/19/2019

CLINICAL DATA: 64-year-old female with dyspnea

EXAM:
CHEST - 2 VIEW

[chest pa]
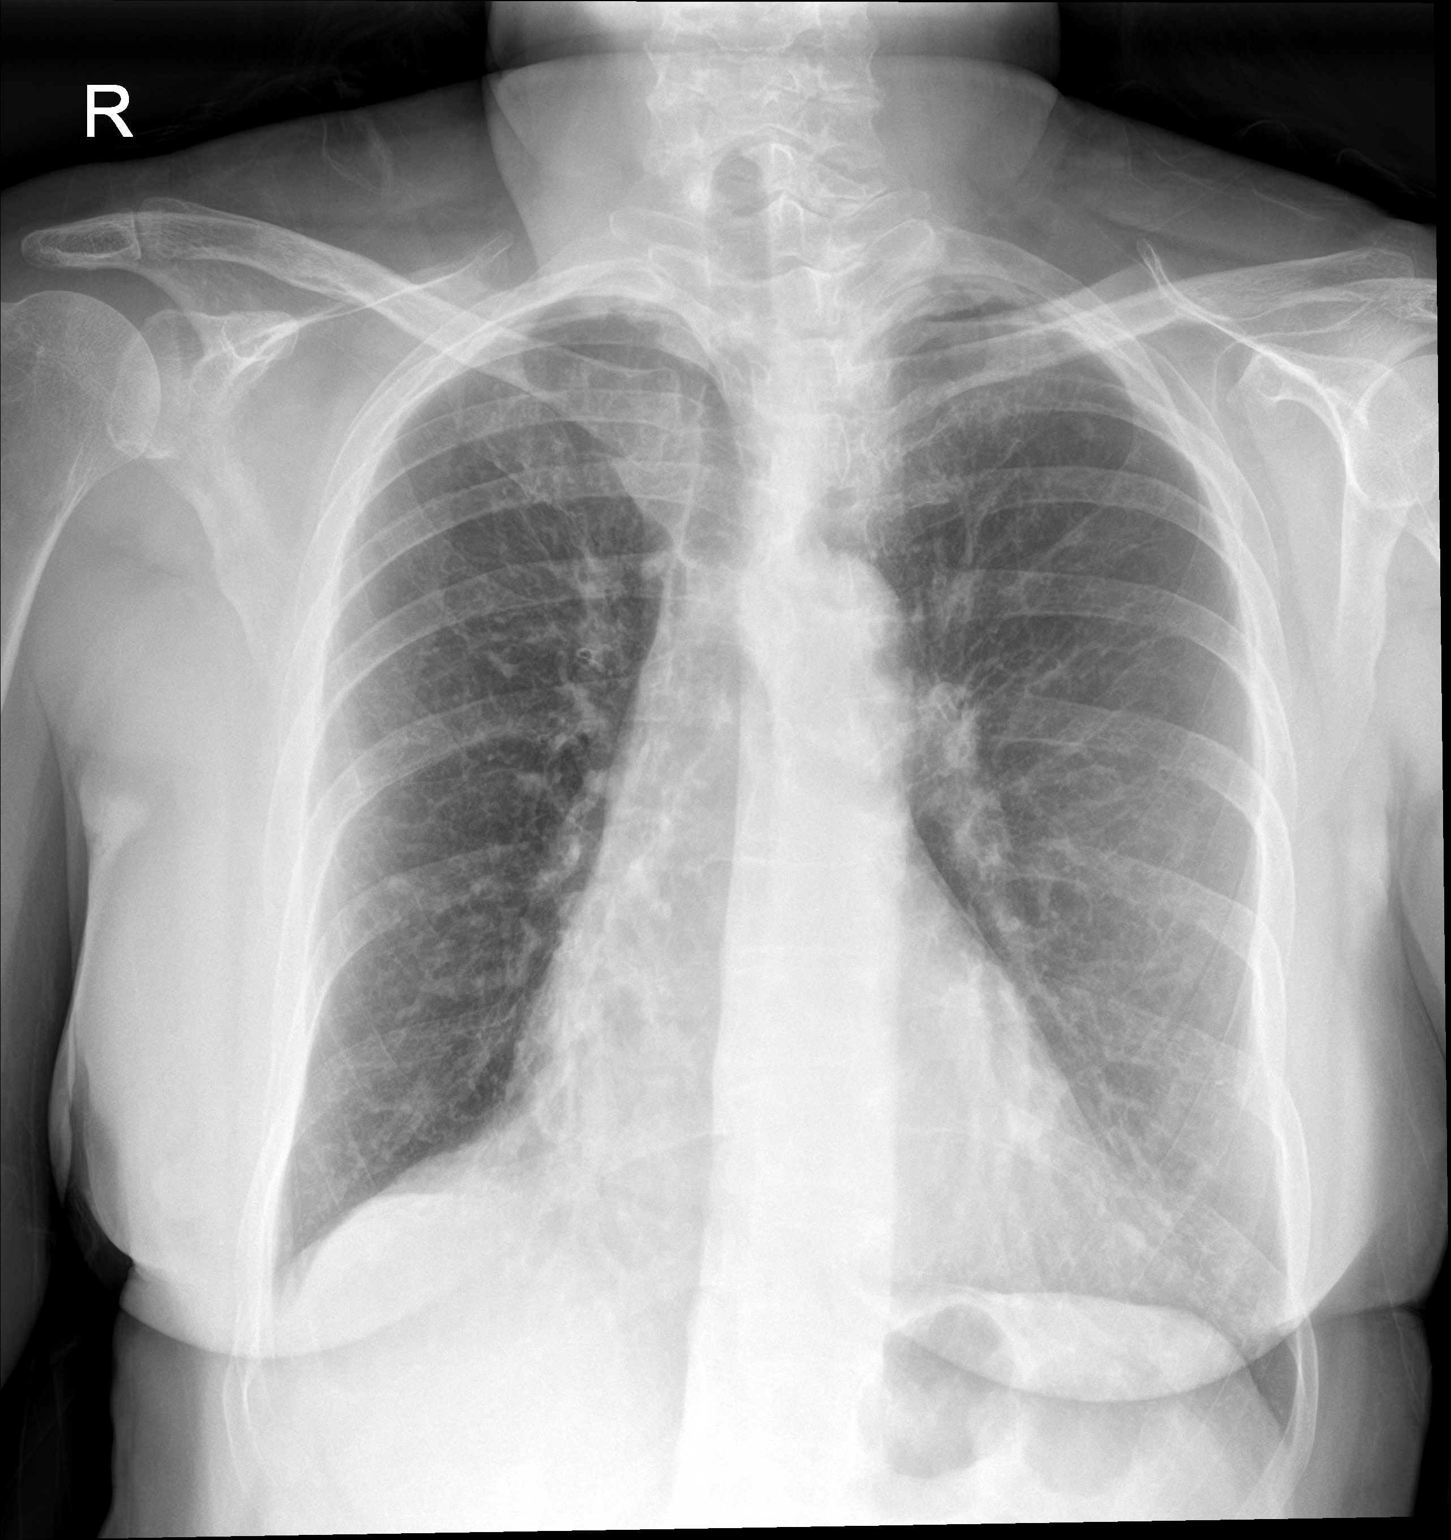

[chest lat]
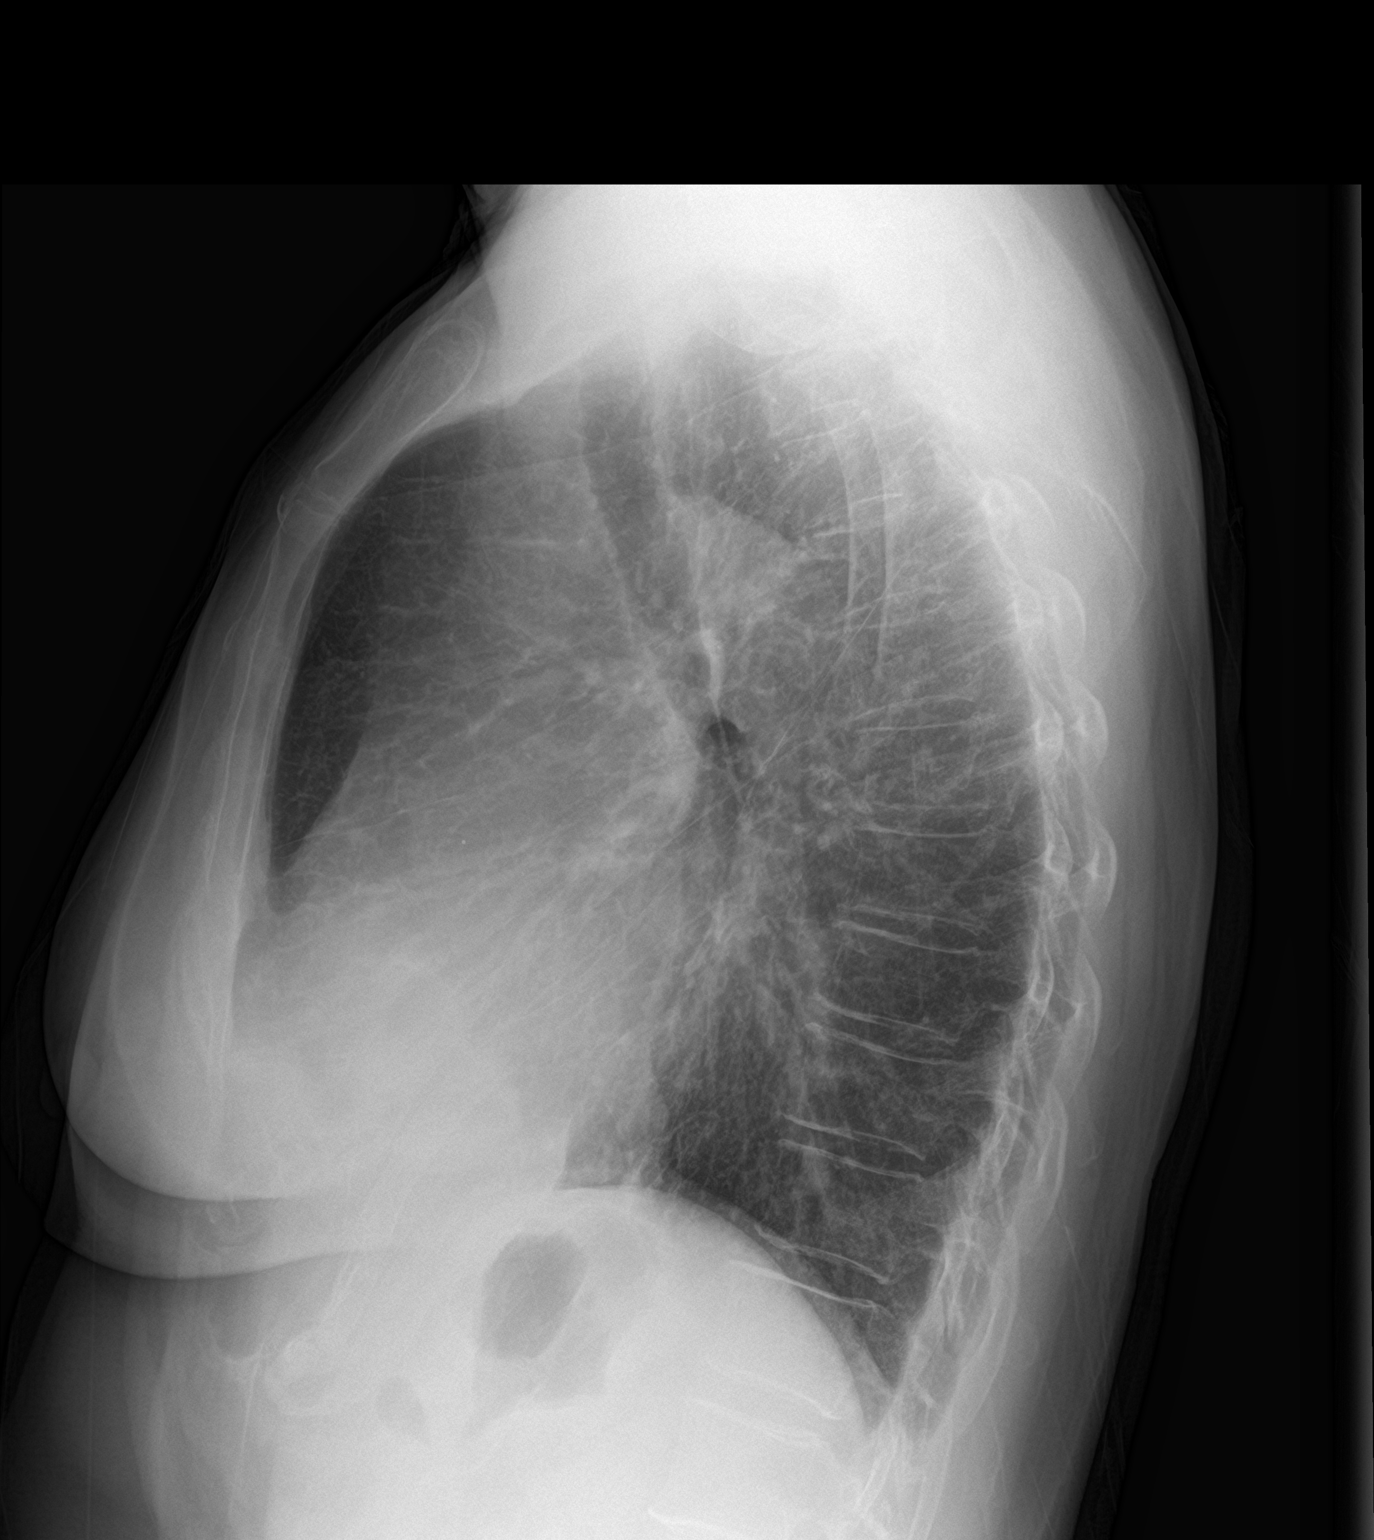

[2 of 2 positions shown; findings below may reference images not displayed]

FINDINGS: Cardiomediastinal silhouette unchanged in size and contour. No
evidence of central vascular congestion. No interlobular septal
thickening.

Pleural-parenchymal thickening at the bilateral lung apices.
Coarsened interstitial markings similar to the prior with no
confluent airspace disease.

No pneumothorax or pleural effusion.

No acute displaced fracture. Degenerative changes of the spine.
IMPRESSION: Similar appearance of chronic lung changes with no definite evidence
of acute cardiopulmonary disease

## 2023-06-08 IMAGING — DX DG LUMBAR SPINE 2-3V
3 series · 3 of 3 positions shown · non-contrast
Comparison: 11/01/2016

CLINICAL DATA: Low back pain

EXAM:
LUMBAR SPINE - 2-3 VIEW

[l-spine ap]
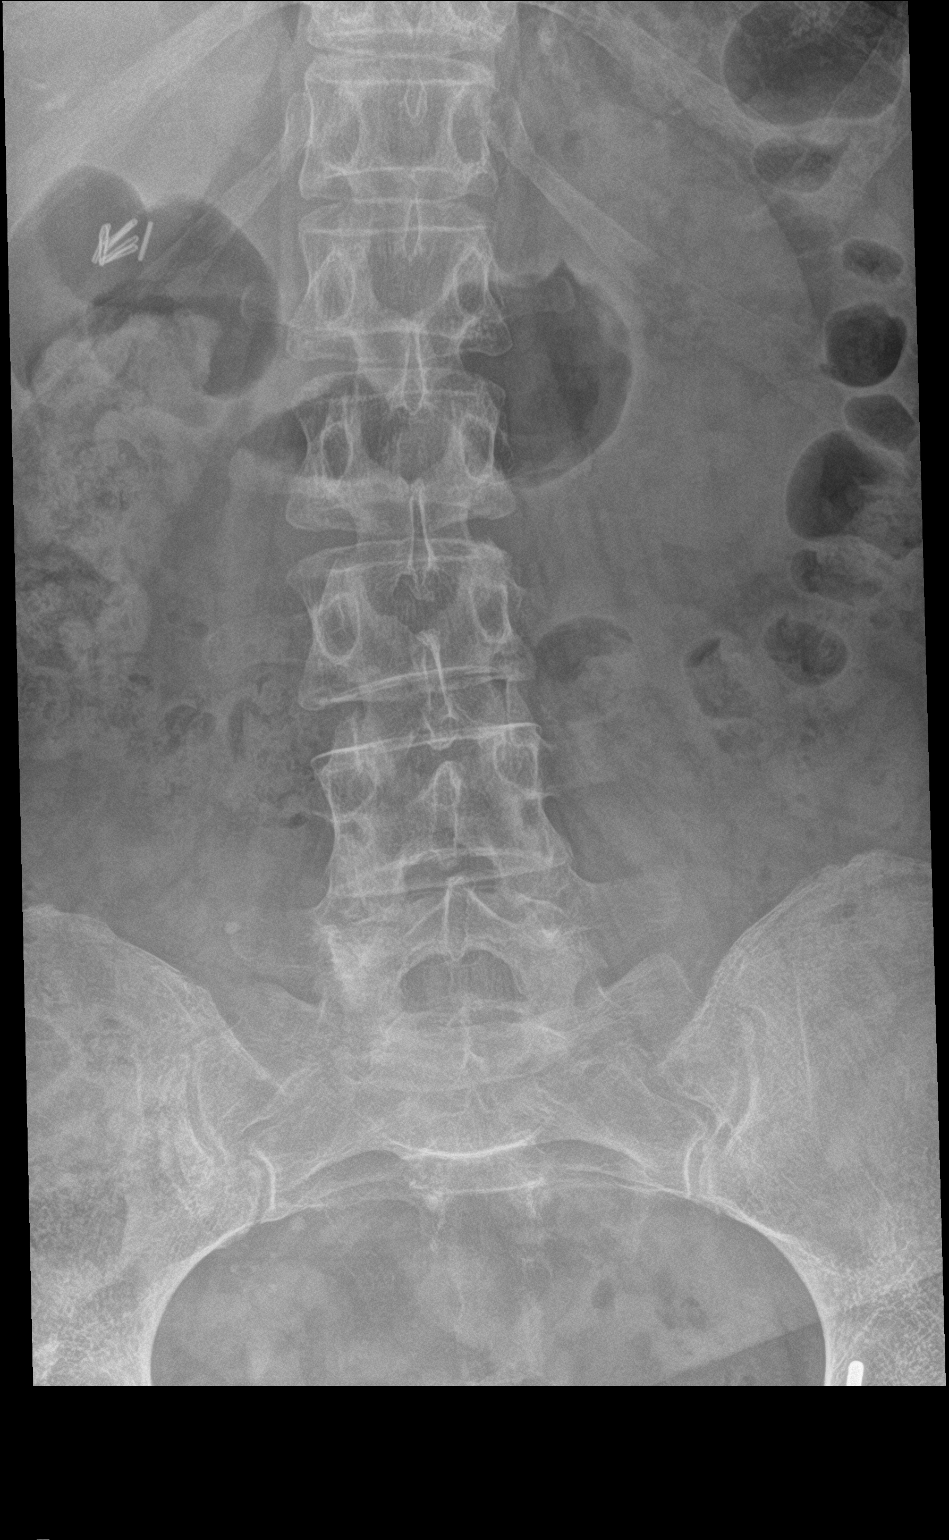

[l-spine lat]
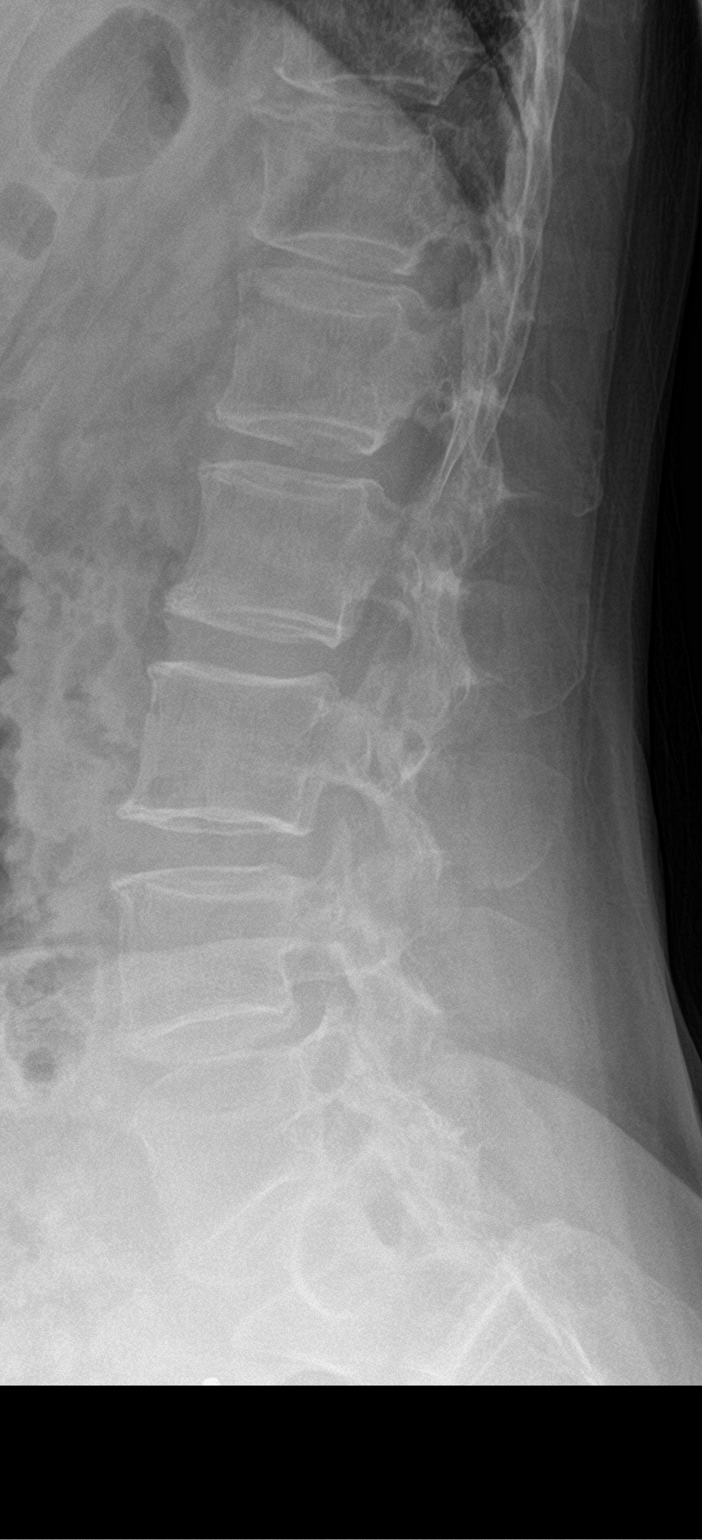

[l-spine spot]
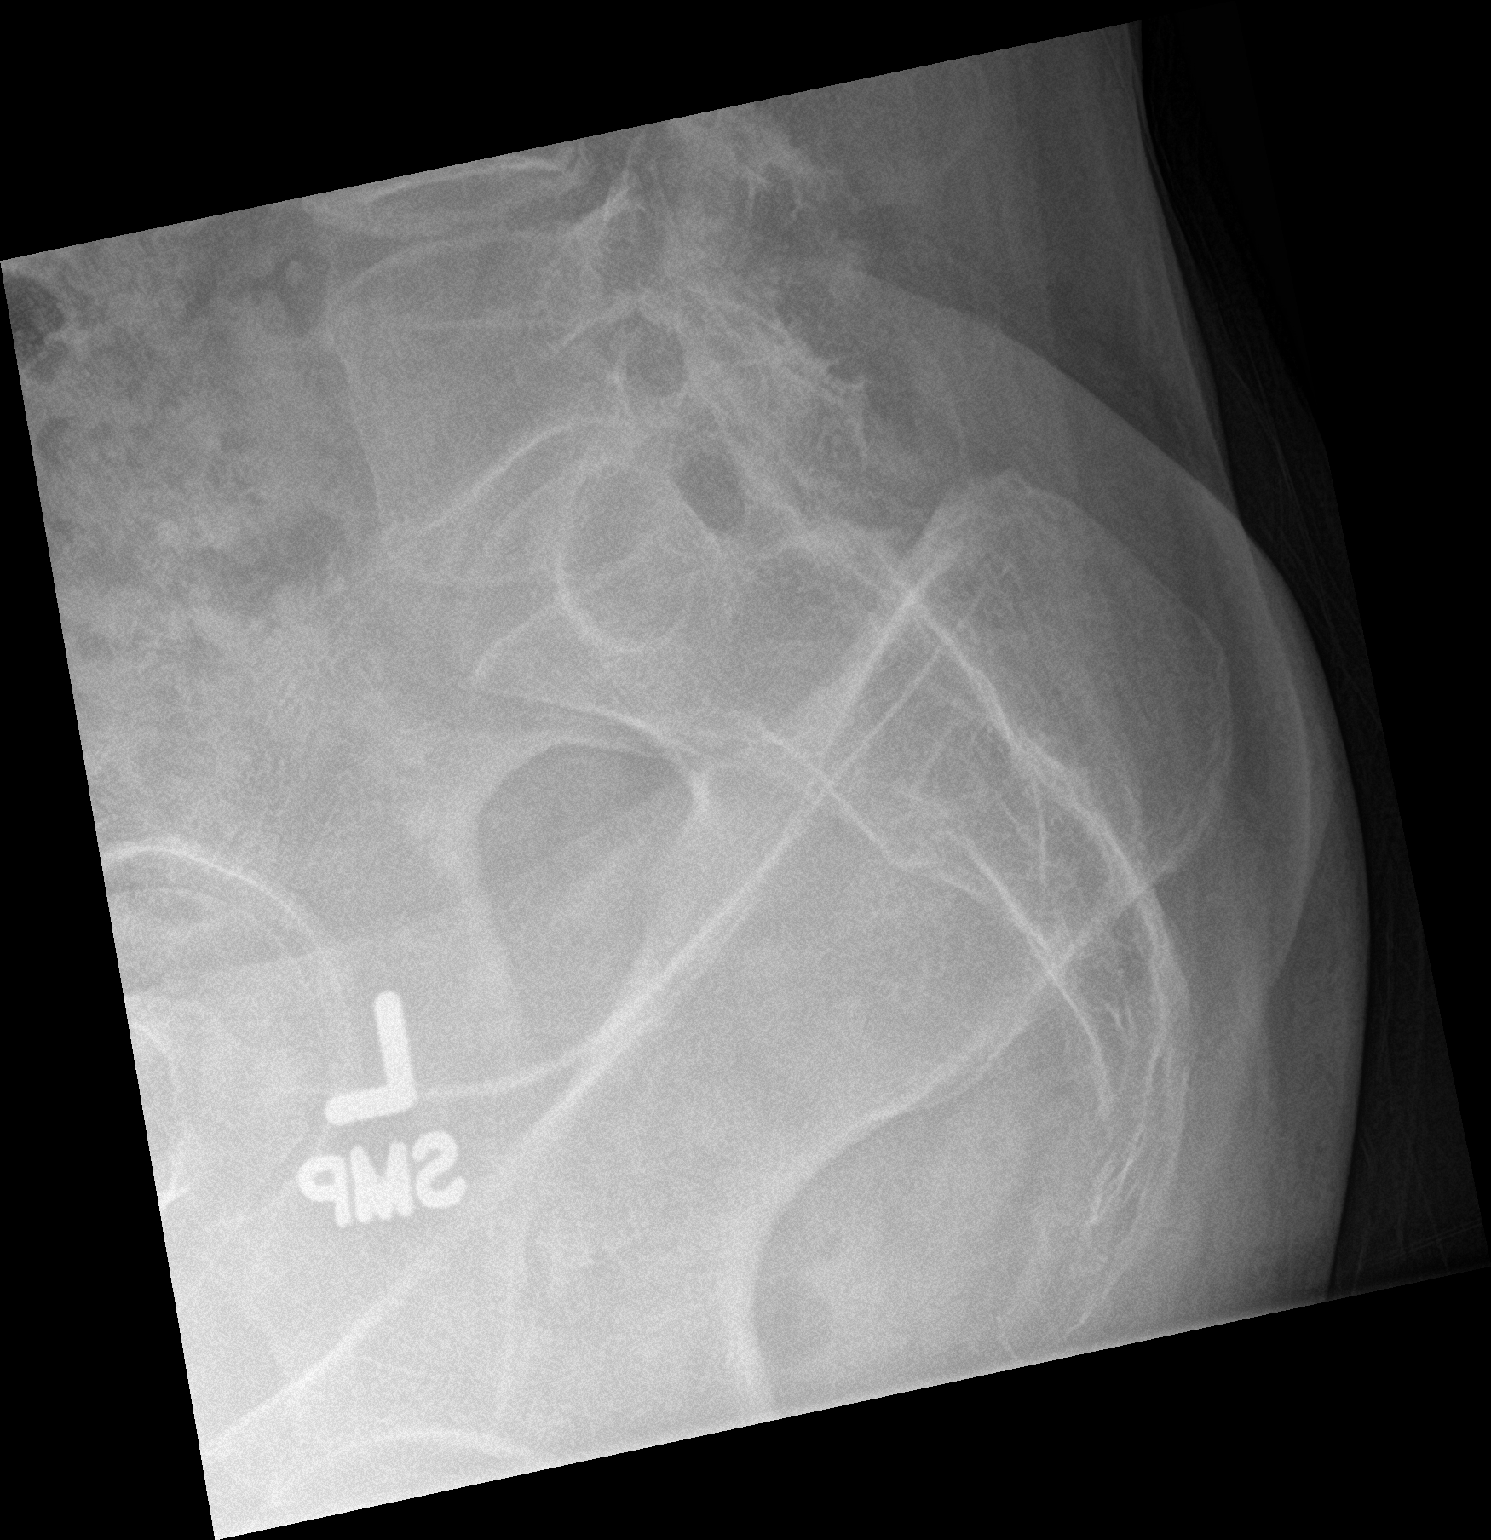

[3 of 3 positions shown; findings below may reference images not displayed]

FINDINGS: Mild dextrocurvature. Vertebral body heights are maintained. The
disc spaces are patent. There are minimal degenerative osteophytes.
IMPRESSION: Minimal degenerative change.

## 2023-08-23 DIAGNOSIS — R5382 Chronic fatigue, unspecified: Secondary | ICD-10-CM | POA: Diagnosis not present

## 2023-08-23 DIAGNOSIS — E785 Hyperlipidemia, unspecified: Secondary | ICD-10-CM | POA: Diagnosis not present

## 2023-08-29 DIAGNOSIS — Z0001 Encounter for general adult medical examination with abnormal findings: Secondary | ICD-10-CM | POA: Diagnosis not present

## 2023-08-29 DIAGNOSIS — H269 Unspecified cataract: Secondary | ICD-10-CM | POA: Diagnosis not present

## 2023-08-29 DIAGNOSIS — R945 Abnormal results of liver function studies: Secondary | ICD-10-CM | POA: Diagnosis not present

## 2023-08-29 DIAGNOSIS — M519 Unspecified thoracic, thoracolumbar and lumbosacral intervertebral disc disorder: Secondary | ICD-10-CM | POA: Diagnosis not present

## 2023-08-29 DIAGNOSIS — E785 Hyperlipidemia, unspecified: Secondary | ICD-10-CM | POA: Diagnosis not present

## 2023-08-29 DIAGNOSIS — R0609 Other forms of dyspnea: Secondary | ICD-10-CM | POA: Diagnosis not present

## 2023-08-29 DIAGNOSIS — R946 Abnormal results of thyroid function studies: Secondary | ICD-10-CM | POA: Diagnosis not present

## 2023-08-29 DIAGNOSIS — R0602 Shortness of breath: Secondary | ICD-10-CM | POA: Diagnosis not present

## 2023-11-13 DIAGNOSIS — R946 Abnormal results of thyroid function studies: Secondary | ICD-10-CM | POA: Diagnosis not present

## 2023-12-19 NOTE — Telephone Encounter (Signed)
 Patient is calling to schedule a new patient appointment with referral diagnosis of Other abnormal blood chemistry [R79.89]. That diagnosis is not in the decision tree  Please advise, call back [phone] and can leave a voicemail

## 2023-12-19 NOTE — Telephone Encounter (Signed)
 Lvm that appt has been scheduled with next available provider. New patient packet to be mailed to patient.

## 2024-01-01 ENCOUNTER — Ambulatory Visit (INDEPENDENT_AMBULATORY_CARE_PROVIDER_SITE_OTHER): Admitting: "Endocrinology

## 2024-01-01 ENCOUNTER — Encounter: Payer: Self-pay | Admitting: "Endocrinology

## 2024-01-01 ENCOUNTER — Other Ambulatory Visit: Payer: Self-pay | Admitting: "Endocrinology

## 2024-01-01 VITALS — BP 124/80 | HR 68 | Ht 66.0 in | Wt 175.6 lb

## 2024-01-01 DIAGNOSIS — E042 Nontoxic multinodular goiter: Secondary | ICD-10-CM

## 2024-01-01 DIAGNOSIS — E0590 Thyrotoxicosis, unspecified without thyrotoxic crisis or storm: Secondary | ICD-10-CM | POA: Diagnosis not present

## 2024-01-01 MED ORDER — PROPRANOLOL HCL 20 MG PO TABS: 20.0000 mg | ORAL_TABLET | Freq: Every day | ORAL | 1 refills | Status: DC

## 2024-01-01 MED ORDER — PROPRANOLOL HCL 20 MG PO TABS: 20.0000 mg | ORAL_TABLET | Freq: Two times a day (BID) | ORAL | 2 refills | Status: DC

## 2024-01-01 NOTE — Progress Notes (Signed)
 Endocrinology Consult Note                                            01/01/2024, 4:52 PM   Subjective:    Patient ID: Tracey Fox, female    DOB: 05/20/1955, PCP Shona Norleen PEDLAR, MD   Past Medical History:  Diagnosis Date   Anxiety    Depression    GERD (gastroesophageal reflux disease)    Lung cancer [facility])    Brother   Past Surgical History:  Procedure Laterality Date   ABDOMINAL HYSTERECTOMY     BIOPSY  02/22/2017   Procedure: BIOPSY;  Surgeon: Golda Claudis PENNER, MD;  [facility];  Service: Endoscopy;;  esophagus   BIOPSY THYROID   2024   BREAST SURGERY     CHOLECYSTECTOMY     ESOPHAGEAL DILATION N/A 02/22/2017   Procedure: ESOPHAGEAL DILATION;  Surgeon: Golda Claudis PENNER, MD;  [facility];  Service: Endoscopy;  Laterality: N/A;   ESOPHAGOGASTRODUODENOSCOPY N/A 02/22/2017   Procedure: ESOPHAGOGASTRODUODENOSCOPY (EGD);  Surgeon: Golda Claudis PENNER, MD;  [facility];  Service: Endoscopy;  Laterality: N/A;  1:55   Social History   Socioeconomic History   Marital status: Divorced    Spouse name: Not on file   Number of children: 4   Years of education: Not on file   Highest education level: Not on file  Occupational History   Occupation: Print production planner    Employer: [employer]  Tobacco Use   Smoking status: Never    Passive exposure: Current   Smokeless tobacco: Never  Vaping Use   Vaping status: Never Used  Substance and Sexual Activity   Alcohol use: Yes    Comment: occ   Drug use: No   Sexual activity: Not on file  Other Topics Concern   Not on file  Social History Narrative   Not on file   Social Drivers of Health   Financial Resource Strain: Not on file  Food Insecurity: Not on file  Transportation Needs: Not on file  Physical Activity: Not on file  Stress: Not on file  Social Connections: Not on file   Family History  Problem Relation Age of Onset   Diabetes Mother    Heart disease Father    Breast cancer  Sister    Cancer Sister    COPD Brother        oldest brother   Lung cancer Brother    Thyroid  disease Neg Hx    Outpatient Encounter Medications as of 01/01/2024  Medication Sig   albuterol  (VENTOLIN  HFA) 108 (90 Base) MCG/ACT inhaler Inhale 1-2 puffs into the lungs every 6 (six) hours as needed for shortness of breath or wheezing.   [DISCONTINUED] propranolol  (INDERAL ) 20 MG tablet Take 1 tablet (20 mg total) by mouth 2 (two) times daily.   ALPRAZolam  (XANAX ) 1 MG tablet TAKE 1 TABLET(1 MG) BY MOUTH TWICE DAILY AS NEEDED   propranolol  (INDERAL ) 20 MG tablet Take 1 tablet (20 mg total) by mouth daily with breakfast.   No facility-administered encounter medications on file as of 01/01/2024.   ALLERGIES: Allergies  Allergen Reactions   Morphine  Shortness Of Breath and Other (See Comments)    (difficulty breathing, shakiness, used NARCAN  to reverse)    VACCINATION STATUS: Immunization History  Administered Date(s) Administered   Influenza,inj,Quad PF,6+ Mos  03/01/2016   Tdap 03/01/2016    HPI Tracey Fox is 68 y.o. female who presents today with a medical history as above. she is being seen in consultation for hyperthyroidism requested by Shona Norleen PEDLAR, MD.  History is obtained directly from the patient and chart review.  Based on her chart review, she was diagnosed with multinodular goiter several years ago for which she underwent fine-needle aspiration biopsy with benign findings.  She also had suppressed TSH associated with normal free T4 for a few years.  She presents with symptoms including palpitations, irritability, tremors, sleep disturbance. She denies weight loss, gait fluctuating body weight since last year.  She reports some occasional dysphagia and neck discomfort. She denies family history of thyroid  dysfunction or thyroid  malignancy.  She is not currently on thyroid  hormone or thyroid  supplements.  She wishes to have full workup and treatment.  Review of  Systems  Constitutional: +mildly fluctuating body weight,  no fatigue, no subjective hyperthermia, no subjective hypothermia Eyes: no blurry vision, no xerophthalmia ENT: no sore throat, no nodules palpated in throat, + dysphagia, - hoarseness Cardiovascular: no Chest Pain, no Shortness of Breath, no palpitations, no leg swelling Respiratory: no cough, no shortness of breath Gastrointestinal: no Nausea/Vomiting/Diarhhea Musculoskeletal: no muscle/joint aches Skin: no rashes Neurological: no tremors, no numbness, no tingling, no dizziness Psychiatric: no depression, no anxiety  Objective:       01/01/2024    2:15 PM 11/29/2022   10:35 AM 07/19/2022   10:33 AM  Vitals with BMI  Height 5' 6  5' 8  Weight 175 lbs 10 oz  181 lbs 13 oz  BMI 28.36  27.65  Systolic 124 108 887  Diastolic 80 69 74  Pulse 68 72 64    BP 124/80   Pulse 68   Ht 5' 6 (1.676 m)   Wt 175 lb 9.6 oz (79.7 kg)   BMI 28.34 kg/m   Wt Readings from Last 3 Encounters:  01/01/24 175 lb 9.6 oz (79.7 kg)  07/19/22 181 lb 12.8 oz (82.5 kg)  05/06/20 165 lb (74.8 kg)    Physical Exam  Constitutional:  Body mass index is 28.34 kg/m.,  not in acute distress, normal state of mind Eyes: PERRLA, EOMI, no exophthalmos ENT: moist mucous membranes, + gross thyromegaly, no gross cervical lymphadenopathy Cardiovascular: normal precordial activity, Regular Rate and Rhythm, no Murmur/Rubs/Gallops Respiratory:  adequate breathing efforts, no gross chest deformity, Clear to auscultation bilaterally Gastrointestinal: abdomen soft, Non -tender, No distension, Bowel Sounds present, no gross organomegaly Musculoskeletal: no gross deformities, strength intact in all four extremities, no peripheral edema Skin: moist, warm, no rashes Neurological: no tremor with outstretched hands, Deep tendon reflexes normal in bilateral lower extremities.  CMP ( most recent) CMP     Component Value Date/Time   NA 139 11/29/2022 1114   K  4.3 11/29/2022 1114   CL 104 11/29/2022 1114   CO2 21 11/29/2022 1114   GLUCOSE 117 (H) 11/29/2022 1114   GLUCOSE 92 07/19/2022 1127   BUN 15 11/29/2022 1114   CREATININE 0.59 11/29/2022 1114   CREATININE 0.58 05/06/2020 0935   CALCIUM 9.6 11/29/2022 1114   PROT 6.7 11/29/2022 1114   ALBUMIN 4.1 11/29/2022 1114   AST 21 11/29/2022 1114   ALT 27 11/29/2022 1114   ALKPHOS 143 (H) 11/29/2022 1114   BILITOT 1.5 (H) 11/29/2022 1114   GFR 93.42 07/19/2022 1127   EGFR 101 11/29/2022 1114   GFRNONAA 99 05/06/2020 0935  Diabetic Labs (most recent): Lab Results  Component Value Date   HGBA1C 5.6 01/07/2013     Lipid Panel ( most recent) Lipid Panel     Component Value Date/Time   CHOL 182 08/01/2019 1130   TRIG 57 08/01/2019 1130   HDL 56 08/01/2019 1130   CHOLHDL 3.3 08/01/2019 1130   VLDL 16 03/01/2016 1004   LDLCALC 112 (H) 08/01/2019 1130      Lab Results  Component Value Date   TSH 0.26 (L) 07/19/2022   TSH 0.62 02/25/2020   TSH 0.78 08/22/2019   TSH 0.315 (L) 08/18/2019   TSH 0.22 (L) 08/01/2019   FREET4 1.0 02/25/2020   FREET4 0.95 08/19/2019   FREET4 1.2 08/01/2019   FREET4 1.0 08/21/2017   FREET4 0.9 04/17/2016        Fine-needle aspiration biopsy on September 07, 2022 Clinical History: Nodule #1: Isthmus; Inferior, Maximum size: 2.5 cm;  Other 2 dimensions: 2.2 x 1.4 cm, solid/almost completely solid,  isoechoic, TI-RADS total points: 3.  Specimen Submitted:  A. THYROID , ISTHMUS INFERIOR, FINE NEEDLE  ASPIRATION    FINAL MICROSCOPIC DIAGNOSIS:  - Benign follicular nodule (Bethesda category II)   SPECIMEN ADEQUACY:  Satisfactory for evaluation   Clinical History: Nodule #3: Right; Inferior, Maximum size: 1.5 cm;  Other 2 dimensions: 1.3 x 1.1 cm, solid/almost completely solid,  hypoechoic, TI-RADS total points: 4.  Specimen Submitted:  A. THYROID , RT INFERIOR, FINE NEEDLE ASPIRATION    FINAL MICROSCOPIC DIAGNOSIS:  - Benign follicular nodule  (Bethesda category II)   Assessment & Plan:   1. Hyperthyroidism (Primary)  2.  Multinodular goiter  - Tracey Fox  is being seen at a kind request of Shona, Norleen PEDLAR, MD. - I have reviewed her available thyroid  records and clinically evaluated the patient. - Based on these reviews, she has clinical presentation consistent with hyperthyroidism, labs are consistent with suppressed TSH associated with normal free T4.  She will be considered for confirmatory workup with thyroid  uptake and scan.  If she returns with significant uptake, she will be considered for definitive treatment with radioactive iodine thyroid  ablation In the meantime, she may benefit from low-dose beta-blocker, discussed and prescribed propranolol  20 mg p.o. once a day.  Considering benign fine-needle aspiration biopsy results x 2, she would not be considered for thyroid  surgery at this time. - she is advised to maintain close follow up with Shona Norleen PEDLAR, MD for primary care needs.   -Thank you for involving me in the care of this pleasant patient.  Time spent with the patient: 45  minutes spent in  counseling her about hyperthyroidism and the rest in obtaining information about her symptoms, reviewing her previous labs/studies (including abstractions from other facilities),  evaluations, and treatments,  and developing a plan to confirm diagnosis and long term treatment based on the latest standards of care/guidelines; and documenting her care.  Tracey Fox participated in the discussions, expressed understanding, and voiced agreement with the above plans.  All questions were answered to her satisfaction. she is encouraged to contact clinic should she have any questions or concerns prior to her return visit.  Follow up plan: Return in about 2 weeks (around 01/15/2024) for F/U with Thyroid  Uptake and Scan.   Ranny Earl, MD [facility] [address] [place], [place] [address] Phone: [phone]  Fax: [phone]     01/01/2024, 4:52 PM  This note was partially dictated with voice recognition software. Similar sounding  words can be transcribed inadequately or may not  be corrected upon review.

## 2024-01-10 ENCOUNTER — Ambulatory Visit (HOSPITAL_COMMUNITY)
Admission: RE | Admit: 2024-01-10 | Discharge: 2024-01-10 | Disposition: A | Discharge status: Home / Self Care | Source: Ambulatory Visit | Attending: "Endocrinology | Admitting: "Endocrinology

## 2024-01-10 ENCOUNTER — Encounter (HOSPITAL_COMMUNITY): Payer: Self-pay

## 2024-01-10 DIAGNOSIS — E049 Nontoxic goiter, unspecified: Secondary | ICD-10-CM | POA: Diagnosis not present

## 2024-01-10 DIAGNOSIS — E0590 Thyrotoxicosis, unspecified without thyrotoxic crisis or storm: Secondary | ICD-10-CM | POA: Diagnosis present

## 2024-01-10 MED ORDER — SODIUM IODIDE I-123 7.4 MBQ CAPS
300.0000 | ORAL_CAPSULE | Freq: Once | ORAL | Status: AC
  Administered 2024-01-10: 316 via ORAL

## 2024-01-11 ENCOUNTER — Ambulatory Visit (HOSPITAL_COMMUNITY)
Admission: RE | Admit: 2024-01-11 | Discharge: 2024-01-11 | Disposition: A | Discharge status: Home / Self Care | Source: Ambulatory Visit | Attending: "Endocrinology | Admitting: "Endocrinology

## 2024-01-11 DIAGNOSIS — E049 Nontoxic goiter, unspecified: Secondary | ICD-10-CM | POA: Diagnosis not present

## 2024-01-11 DIAGNOSIS — R221 Localized swelling, mass and lump, neck: Secondary | ICD-10-CM | POA: Diagnosis not present

## 2024-01-11 DIAGNOSIS — E0590 Thyrotoxicosis, unspecified without thyrotoxic crisis or storm: Secondary | ICD-10-CM | POA: Diagnosis not present

## 2024-01-15 ENCOUNTER — Ambulatory Visit (INDEPENDENT_AMBULATORY_CARE_PROVIDER_SITE_OTHER): Admitting: "Endocrinology

## 2024-01-15 ENCOUNTER — Encounter: Payer: Self-pay | Admitting: "Endocrinology

## 2024-01-15 VITALS — BP 108/70 | HR 52 | Ht 66.0 in | Wt 176.2 lb

## 2024-01-15 DIAGNOSIS — E042 Nontoxic multinodular goiter: Secondary | ICD-10-CM | POA: Diagnosis not present

## 2024-01-15 DIAGNOSIS — E0590 Thyrotoxicosis, unspecified without thyrotoxic crisis or storm: Secondary | ICD-10-CM | POA: Diagnosis not present

## 2024-01-15 MED ORDER — PROPRANOLOL HCL 20 MG PO TABS: 10.0000 mg | ORAL_TABLET | Freq: Every day | ORAL | 0 refills | Status: DC

## 2024-01-15 MED ORDER — METHIMAZOLE 5 MG PO TABS: 5.0000 mg | ORAL_TABLET | Freq: Every day | ORAL | 0 refills | Status: DC

## 2024-01-15 NOTE — Progress Notes (Signed)
 01/15/2024, 2:01 PM  Endocrinology follow-up note   Subjective:    Patient ID: Tracey Fox, female    DOB: 1955/11/07, PCP Shona Norleen PEDLAR, MD   Past Medical History:  Diagnosis Date   Anxiety    Depression    GERD (gastroesophageal reflux disease)    Lung cancer [facility])    Brother   Past Surgical History:  Procedure Laterality Date   ABDOMINAL HYSTERECTOMY     BIOPSY  02/22/2017   Procedure: BIOPSY;  Surgeon: Golda Claudis PENNER, MD;  [facility];  Service: Endoscopy;;  esophagus   BIOPSY THYROID   2024   BREAST SURGERY     CHOLECYSTECTOMY     ESOPHAGEAL DILATION N/A 02/22/2017   Procedure: ESOPHAGEAL DILATION;  Surgeon: Golda Claudis PENNER, MD;  [facility];  Service: Endoscopy;  Laterality: N/A;   ESOPHAGOGASTRODUODENOSCOPY N/A 02/22/2017   Procedure: ESOPHAGOGASTRODUODENOSCOPY (EGD);  Surgeon: Golda Claudis PENNER, MD;  [facility];  Service: Endoscopy;  Laterality: N/A;  1:55   Social History   Socioeconomic History   Marital status: Divorced    Spouse name: Not on file   Number of children: 4   Years of education: Not on file   Highest education level: Not on file  Occupational History   Occupation: Print production planner    Employer: [employer]  Tobacco Use   Smoking status: Never    Passive exposure: Current   Smokeless tobacco: Never  Vaping Use   Vaping status: Never Used  Substance and Sexual Activity   Alcohol use: Yes    Comment: occ   Drug use: No   Sexual activity: Not on file  Other Topics Concern   Not on file  Social History Narrative   Not on file   Social Drivers of Health   Financial Resource Strain: Not on file  Food Insecurity: Not on file  Transportation Needs: Not on file  Physical Activity: Not on file  Stress: Not on file  Social Connections: Not on file   Family History  Problem Relation Age of Onset   Diabetes Mother    Heart disease Father    Breast  cancer Sister    Cancer Sister    COPD Brother        oldest brother   Lung cancer Brother    Thyroid  disease Neg Hx    Outpatient Encounter Medications as of 01/15/2024  Medication Sig   methimazole  (TAPAZOLE ) 5 MG tablet Take 1 tablet (5 mg total) by mouth daily with breakfast.   albuterol  (VENTOLIN  HFA) 108 (90 Base) MCG/ACT inhaler Inhale 1-2 puffs into the lungs every 6 (six) hours as needed for shortness of breath or wheezing.   ALPRAZolam  (XANAX ) 1 MG tablet TAKE 1 TABLET(1 MG) BY MOUTH TWICE DAILY AS NEEDED   propranolol  (INDERAL ) 20 MG tablet Take 0.5 tablets (10 mg total) by mouth daily with breakfast.   [DISCONTINUED] propranolol  (INDERAL ) 20 MG tablet TAKE 1 TABLET(20 MG) BY MOUTH DAILY WITH BREAKFAST   No facility-administered encounter medications on file as of 01/15/2024.   ALLERGIES: Allergies  Allergen Reactions   Morphine  Shortness Of Breath and Other (See Comments)    (difficulty breathing, shakiness, used NARCAN  to reverse)  VACCINATION STATUS: Immunization History  Administered Date(s) Administered   Influenza,inj,Quad PF,6+ Mos 03/01/2016   Tdap 03/01/2016    HPI Korena Fox is 68 y.o. female who presents today with a medical history as above. she is being seen in follow-up after she was seen in  consultation for hyperthyroidism requested by Shona Norleen PEDLAR, MD.   She was seen with Sam symptoms including palpitations, irritability, tremors, sleep disturbance and fluctuating body weight.  She was sent for thyroid  uptake and scan based on documented history of suppressed TSH.  Her uptake is showing high normal uniform uptake  at 25% in 24 hours. She did have benign findings on liver aspiration biopsy of a nodule prior to her last visit.   She reports some occasional dysphagia and neck discomfort. She denies family history of thyroid  dysfunction or thyroid  malignancy.  She is not currently on thyroid  hormone or thyroid  supplements.  She wishes to have full  workup and treatment.  She has responded to the low-dose propranolol  given to her during her last visit with improving palpitations and tremors.  Review of Systems  Constitutional: +mildly fluctuating body weight,  no fatigue, no subjective hyperthermia, no subjective hypothermia   Objective:       01/15/2024    1:01 PM 01/01/2024    2:15 PM 11/29/2022   10:35 AM  Vitals with BMI  Height 5' 6 5' 6   Weight 176 lbs 3 oz 175 lbs 10 oz   BMI 28.45 28.36   Systolic 108 124 891  Diastolic 70 80 69  Pulse 52 68 72    BP 108/70   Pulse (!) 52   Ht 5' 6 (1.676 m)   Wt 176 lb 3.2 oz (79.9 kg)   BMI 28.44 kg/m   Wt Readings from Last 3 Encounters:  01/15/24 176 lb 3.2 oz (79.9 kg)  01/01/24 175 lb 9.6 oz (79.7 kg)  07/19/22 181 lb 12.8 oz (82.5 kg)    Physical Exam  Constitutional:  Body mass index is 28.44 kg/m.,  not in acute distress, normal state of mind Eyes: PERRLA, EOMI, no exophthalmos ENT: moist mucous membranes, + gross thyromegaly, no gross cervical lymphadenopathy   CMP ( most recent) CMP     Component Value Date/Time   NA 139 11/29/2022 1114   K 4.3 11/29/2022 1114   CL 104 11/29/2022 1114   CO2 21 11/29/2022 1114   GLUCOSE 117 (H) 11/29/2022 1114   GLUCOSE 92 07/19/2022 1127   BUN 15 11/29/2022 1114   CREATININE 0.59 11/29/2022 1114   CREATININE 0.58 05/06/2020 0935   CALCIUM 9.6 11/29/2022 1114   PROT 6.7 11/29/2022 1114   ALBUMIN 4.1 11/29/2022 1114   AST 21 11/29/2022 1114   ALT 27 11/29/2022 1114   ALKPHOS 143 (H) 11/29/2022 1114   BILITOT 1.5 (H) 11/29/2022 1114   GFR 93.42 07/19/2022 1127   EGFR 101 11/29/2022 1114   GFRNONAA 99 05/06/2020 0935     Diabetic Labs (most recent): Lab Results  Component Value Date   HGBA1C 5.6 01/07/2013     Lipid Panel ( most recent) Lipid Panel     Component Value Date/Time   CHOL 182 08/01/2019 1130   TRIG 57 08/01/2019 1130   HDL 56 08/01/2019 1130   CHOLHDL 3.3 08/01/2019 1130   VLDL 16  03/01/2016 1004   LDLCALC 112 (H) 08/01/2019 1130      Lab Results  Component Value Date   TSH 0.26 (L) 07/19/2022   TSH  0.62 02/25/2020   TSH 0.78 08/22/2019   TSH 0.315 (L) 08/18/2019   TSH 0.22 (L) 08/01/2019   FREET4 1.0 02/25/2020   FREET4 0.95 08/19/2019   FREET4 1.2 08/01/2019   FREET4 1.0 08/21/2017   FREET4 0.9 04/17/2016        Fine-needle aspiration biopsy on September 07, 2022 Clinical History: Nodule #1: Isthmus; Inferior, Maximum size: 2.5 cm;  Other 2 dimensions: 2.2 x 1.4 cm, solid/almost completely solid,  isoechoic, TI-RADS total points: 3.  Specimen Submitted:  A. THYROID , ISTHMUS INFERIOR, FINE NEEDLE  ASPIRATION    FINAL MICROSCOPIC DIAGNOSIS:  - Benign follicular nodule (Bethesda category II)   SPECIMEN ADEQUACY:  Satisfactory for evaluation   Clinical History: Nodule #3: Right; Inferior, Maximum size: 1.5 cm;  Other 2 dimensions: 1.3 x 1.1 cm, solid/almost completely solid,  hypoechoic, TI-RADS total points: 4.  Specimen Submitted:  A. THYROID , RT INFERIOR, FINE NEEDLE ASPIRATION    FINAL MICROSCOPIC DIAGNOSIS:  - Benign follicular nodule (Bethesda category II)   Thyroid  uptake and scan on January 11, 2024  COMPARISON:  None Available.   FINDINGS: Uniform uptake within an enlarged thyroid  gland.  No nodularity   4 hour I-123 uptake = 14.8% (normal 5-20%)   24 hour I-123 uptake = 25.4% (normal 10-30%)   IMPRESSION: Enlarged thyroid  gland with normal iodine uptake.  No nodularity.  Assessment & Plan:   1. Hyperthyroidism (Primary)  2.  Multinodular goiter  - I have reviewed her new and available thyroid  records and clinically evaluated the patient. - Based on these reviews, she has clinical presentation consistent with hyperthyroidism, high normal uniform uptake, suppressed TSH.   Based on only high normal uptake, she will not need ablative treatment with radioactive iodine therapy, however may benefit from low-dose methimazole  to  achieve euthyroid presentation.  I discussed and prescribed methimazole  5 mg p.o. daily at breakfast, advised to lower her propranolol  to 10 mg p.o. daily at breakfast.  Considering benign fine-needle aspiration biopsy results x 2, she would not be considered for thyroid  surgery at this time.  She will return in 3 months with repeat thyroid  function tests. - she is advised to maintain close follow up with Shona Norleen PEDLAR, MD for primary care needs.  I spent  22  minutes in the care of the patient today including review of labs from Thyroid  Function, CMP, and other relevant labs ; imaging/biopsy records (current and previous including abstractions from other facilities); face-to-face time discussing  her lab results and symptoms, medications doses, her options of short and long term treatment based on the latest standards of care / guidelines;   and documenting the encounter.  Tracey Fox  participated in the discussions, expressed understanding, and voiced agreement with the above plans.  All questions were answered to her satisfaction. she is encouraged to contact clinic should she have any questions or concerns prior to her return visit.   Follow up plan: Return in about 3 months (around 04/15/2024) for F/U with Pre-visit Labs.   Ranny Earl, MD [facility] [address] [place], [place] [address] Phone: [phone]  Fax: [phone]     01/15/2024, 2:01 PM  This note was partially dictated with voice recognition software. Similar sounding words can be transcribed inadequately or may not  be corrected upon review.

## 2024-02-22 DIAGNOSIS — E785 Hyperlipidemia, unspecified: Secondary | ICD-10-CM | POA: Diagnosis not present

## 2024-02-22 DIAGNOSIS — R946 Abnormal results of thyroid function studies: Secondary | ICD-10-CM | POA: Diagnosis not present

## 2024-02-22 DIAGNOSIS — R7989 Other specified abnormal findings of blood chemistry: Secondary | ICD-10-CM | POA: Diagnosis not present

## 2024-02-23 LAB — LAB REPORT - SCANNED
EGFR: 96
Free T4: 0.9 ng/dL
TSH: 1.25 (ref 0.41–5.90)

## 2024-02-28 DIAGNOSIS — H269 Unspecified cataract: Secondary | ICD-10-CM | POA: Diagnosis not present

## 2024-02-28 DIAGNOSIS — Z1211 Encounter for screening for malignant neoplasm of colon: Secondary | ICD-10-CM | POA: Diagnosis not present

## 2024-02-28 DIAGNOSIS — M519 Unspecified thoracic, thoracolumbar and lumbosacral intervertebral disc disorder: Secondary | ICD-10-CM | POA: Diagnosis not present

## 2024-02-28 DIAGNOSIS — E785 Hyperlipidemia, unspecified: Secondary | ICD-10-CM | POA: Diagnosis not present

## 2024-02-28 DIAGNOSIS — K7689 Other specified diseases of liver: Secondary | ICD-10-CM | POA: Diagnosis not present

## 2024-02-28 DIAGNOSIS — Z1231 Encounter for screening mammogram for malignant neoplasm of breast: Secondary | ICD-10-CM | POA: Diagnosis not present

## 2024-02-28 DIAGNOSIS — R946 Abnormal results of thyroid function studies: Secondary | ICD-10-CM | POA: Diagnosis not present

## 2024-03-21 LAB — COLOGUARD

## 2024-04-23 ENCOUNTER — Ambulatory Visit (INDEPENDENT_AMBULATORY_CARE_PROVIDER_SITE_OTHER): Admitting: "Endocrinology

## 2024-04-23 ENCOUNTER — Encounter: Payer: Self-pay | Admitting: "Endocrinology

## 2024-04-23 ENCOUNTER — Other Ambulatory Visit: Payer: Self-pay | Admitting: "Endocrinology

## 2024-04-23 VITALS — BP 110/62 | HR 56 | Ht 66.0 in | Wt 178.8 lb

## 2024-04-23 DIAGNOSIS — E042 Nontoxic multinodular goiter: Secondary | ICD-10-CM

## 2024-04-23 DIAGNOSIS — E0590 Thyrotoxicosis, unspecified without thyrotoxic crisis or storm: Secondary | ICD-10-CM | POA: Diagnosis not present

## 2024-04-23 MED ORDER — METHIMAZOLE 5 MG PO TABS: 2.5000 mg | ORAL_TABLET | Freq: Every day | ORAL | 0 refills | Status: DC

## 2024-04-23 NOTE — Progress Notes (Signed)
 04/23/2024, 10:20 AM  Endocrinology follow-up note   Subjective:    Patient ID: Tracey Fox, female    DOB: [birth year], PCP Shona Norleen PEDLAR, MD   Past Medical History:  Diagnosis Date   Anxiety    Depression    GERD (gastroesophageal reflux disease)    Lung cancer (HCC)    Brother   Past Surgical History:  Procedure Laterality Date   ABDOMINAL HYSTERECTOMY     BIOPSY  02/22/2017   Procedure: BIOPSY;  Surgeon: Golda Claudis PENNER, MD;  Location: [facility];  Service: Endoscopy;;  esophagus   BIOPSY THYROID   2024   BREAST SURGERY     CHOLECYSTECTOMY     ESOPHAGEAL DILATION N/A 02/22/2017   Procedure: ESOPHAGEAL DILATION;  Surgeon: Golda Claudis PENNER, MD;  Location: [facility];  Service: Endoscopy;  Laterality: N/A;   ESOPHAGOGASTRODUODENOSCOPY N/A 02/22/2017   Procedure: ESOPHAGOGASTRODUODENOSCOPY (EGD);  Surgeon: Golda Claudis PENNER, MD;  Location: [facility];  Service: Endoscopy;  Laterality: N/A;  1:55   Social History   Socioeconomic History   Marital status: Divorced    Spouse name: Not on file   Number of children: 4   Years of education: Not on file   Highest education level: Not on file  Occupational History   Occupation: print production planner    Employer: [employer]  Tobacco Use   Smoking status: Never    Passive exposure: Current   Smokeless tobacco: Never  Vaping Use   Vaping status: Never Used  Substance and Sexual Activity   Alcohol use: Yes    Comment: occ   Drug use: No   Sexual activity: Not on file  Other Topics Concern   Not on file  Social History Narrative   Not on file   Social Drivers of Health   Financial Resource Strain: Not on file  Food Insecurity: Not on file  Transportation Needs: Not on file  Physical Activity: Not on file  Stress: Not on file  Social Connections: Not on file   Family History  Problem Relation Age of Onset   Diabetes Mother    Heart disease Father    Breast  cancer Sister    Cancer Sister    COPD Brother        oldest brother   Lung cancer Brother    Thyroid  disease Neg Hx    Outpatient Encounter Medications as of 04/23/2024  Medication Sig   albuterol  (VENTOLIN  HFA) 108 (90 Base) MCG/ACT inhaler Inhale 1-2 puffs into the lungs every 6 (six) hours as needed for shortness of breath or wheezing.   ALPRAZolam  (XANAX ) 1 MG tablet TAKE 1 TABLET(1 MG) BY MOUTH TWICE DAILY AS NEEDED   [DISCONTINUED] methimazole  (TAPAZOLE ) 5 MG tablet Take 1 tablet (5 mg total) by mouth daily with breakfast.   [DISCONTINUED] methimazole  (TAPAZOLE ) 5 MG tablet Take 0.5 tablets (2.5 mg total) by mouth daily with breakfast.   [DISCONTINUED] propranolol  (INDERAL ) 20 MG tablet Take 0.5 tablets (10 mg total) by mouth daily with breakfast. (Patient not taking: Reported on 04/23/2024)   No facility-administered encounter medications on file as of 04/23/2024.   ALLERGIES: Allergies  Allergen Reactions   Morphine  Shortness Of Breath and Other (See Comments)    (  difficulty breathing, shakiness, used NARCAN  to reverse)    VACCINATION STATUS: Immunization History  Administered Date(s) Administered   Influenza,inj,Quad PF,6+ Mos 03/01/2016   Tdap 03/01/2016    HPI Tracey Fox is 68 y.o. female who presents today with a medical history as above. she is being seen in follow-up after she was seen in  consultation for hyperthyroidism requested by Shona Norleen PEDLAR, MD.   She was seen with  symptoms including palpitations, irritability, tremors, sleep disturbance and fluctuating body weight.  Prior thyroid  uptake and scan uniform high normal uptake at 25% at 24 hours.  She was not offered ablative treatment.  Instead she was given methimazole  5 mg p.o. daily at breakfast.  She presents with treatment effect with thyroid  function test within normal range. She did have benign findings on liver aspiration biopsy of a nodule prior to her last visit.   She reports some occasional  dysphagia and neck discomfort. She denies family history of thyroid  dysfunction or thyroid  malignancy.  She is not currently on thyroid  hormone or thyroid  supplements.  She has developed bradycardia on propranolol  which she has stopped.  Review of Systems  Constitutional: +mildly fluctuating body weight,  no fatigue, no subjective hyperthermia, no subjective hypothermia   Objective:       04/23/2024    9:45 AM 01/15/2024    1:01 PM 01/01/2024    2:15 PM  Vitals with BMI  Height 5' 6 5' 6 5' 6  Weight 178 lbs 13 oz 176 lbs 3 oz 175 lbs 10 oz  BMI 28.87 28.45 28.36  Systolic 110 108 875  Diastolic 62 70 80  Pulse 56 52 68    BP 110/62   Pulse (!) 56   Ht 5' 6 (1.676 m)   Wt 178 lb 12.8 oz (81.1 kg)   BMI 28.86 kg/m   Wt Readings from Last 3 Encounters:  04/23/24 178 lb 12.8 oz (81.1 kg)  01/15/24 176 lb 3.2 oz (79.9 kg)  01/01/24 175 lb 9.6 oz (79.7 kg)    Physical Exam  Constitutional:  Body mass index is 28.86 kg/m.,  not in acute distress, normal state of mind Eyes: PERRLA, EOMI, no exophthalmos ENT: moist mucous membranes, + gross thyromegaly, no gross cervical lymphadenopathy   CMP ( most recent) CMP     Component Value Date/Time   NA 139 11/29/2022 1114   K 4.3 11/29/2022 1114   CL 104 11/29/2022 1114   CO2 21 11/29/2022 1114   GLUCOSE 117 (H) 11/29/2022 1114   GLUCOSE 92 07/19/2022 1127   BUN 15 11/29/2022 1114   CREATININE 0.59 11/29/2022 1114   CREATININE 0.58 05/06/2020 0935   CALCIUM 9.6 11/29/2022 1114   PROT 6.7 11/29/2022 1114   ALBUMIN 4.1 11/29/2022 1114   AST 21 11/29/2022 1114   ALT 27 11/29/2022 1114   ALKPHOS 143 (H) 11/29/2022 1114   BILITOT 1.5 (H) 11/29/2022 1114   GFR 93.42 07/19/2022 1127   EGFR 96.0 02/22/2024 0000   EGFR 101 11/29/2022 1114   GFRNONAA 99 05/06/2020 0935     Diabetic Labs (most recent): Lab Results  Component Value Date   HGBA1C 5.6 01/07/2013     Lipid Panel ( most recent) Lipid Panel      Component Value Date/Time   CHOL 182 08/01/2019 1130   TRIG 57 08/01/2019 1130   HDL 56 08/01/2019 1130   CHOLHDL 3.3 08/01/2019 1130   VLDL 16 03/01/2016 1004   LDLCALC 112 (H) 08/01/2019 1130  Lab Results  Component Value Date   TSH 1.25 02/22/2024   TSH 0.26 (L) 07/19/2022   TSH 0.62 02/25/2020   TSH 0.78 08/22/2019   TSH 0.315 (L) 08/18/2019   FREET4 0.90 02/22/2024   FREET4 1.0 02/25/2020   FREET4 0.95 08/19/2019   FREET4 1.2 08/01/2019   FREET4 1.0 08/21/2017        Fine-needle aspiration biopsy on September 07, 2022 Clinical History: Nodule #1: Isthmus; Inferior, Maximum size: 2.5 cm;  Other 2 dimensions: 2.2 x 1.4 cm, solid/almost completely solid,  isoechoic, TI-RADS total points: 3.  Specimen Submitted:  A. THYROID , ISTHMUS INFERIOR, FINE NEEDLE  ASPIRATION    FINAL MICROSCOPIC DIAGNOSIS:  - Benign follicular nodule (Bethesda category II)   SPECIMEN ADEQUACY:  Satisfactory for evaluation   Clinical History: Nodule #3: Right; Inferior, Maximum size: 1.5 cm;  Other 2 dimensions: 1.3 x 1.1 cm, solid/almost completely solid,  hypoechoic, TI-RADS total points: 4.  Specimen Submitted:  A. THYROID , RT INFERIOR, FINE NEEDLE ASPIRATION    FINAL MICROSCOPIC DIAGNOSIS:  - Benign follicular nodule (Bethesda category II)   Thyroid  uptake and scan on January 11, 2024  COMPARISON:  None Available.   FINDINGS: Uniform uptake within an enlarged thyroid  gland.  No nodularity   4 hour I-123 uptake = 14.8% (normal 5-20%)   24 hour I-123 uptake = 25.4% (normal 10-30%)   IMPRESSION: Enlarged thyroid  gland with normal iodine uptake.  No nodularity.  Assessment & Plan:   1. Hyperthyroidism (Primary)  2.  Multinodular goiter  - I have reviewed her new and available thyroid  records and clinically evaluated the patient. - Based on these reviews, she has clinical presentation consistent with hyperthyroidism, high normal uniform uptake, suppressed TSH.   Based on  only high normal uptake, she was not given ablative treatment with radioactive iodine.  She has responded to methimazole .  I discussed and lowered her methimazole  to 2.5 mg p.o. daily at breakfast.  Due to inadvertent bradycardia which is symptomatic, she is advised to stay off of propranolol  for now.  Considering benign fine-needle aspiration biopsy results x 2, she would not be considered for thyroid  surgery at this time.  She will return in 3 months with repeat thyroid  function tests. - she is advised to maintain close follow up with Shona Norleen PEDLAR, MD for primary care needs.   I spent  20  minutes in the care of the patient today including review of labs from Thyroid  Function, CMP, and other relevant labs ; imaging/biopsy records (current and previous including abstractions from other facilities); face-to-face time discussing  her lab results and symptoms, medications doses, her options of short and long term treatment based on the latest standards of care / guidelines;   and documenting the encounter.  Tracey Fox  participated in the discussions, expressed understanding, and voiced agreement with the above plans.  All questions were answered to her satisfaction. she is encouraged to contact clinic should she have any questions or concerns prior to her return visit.   Follow up plan: Return in about 3 months (around 07/22/2024) for F/U with Pre-visit Labs.   Ranny Earl, MD [facility] [address] [place], [place] [address] Phone: [phone]  Fax: [phone]     04/23/2024, 10:20 AM  This note was partially dictated with voice recognition software. Similar sounding words can be transcribed inadequately or may not  be corrected upon review.

## 2024-07-22 ENCOUNTER — Ambulatory Visit: Admitting: "Endocrinology
